# Patient Record
Sex: Male | Born: 1970
Health system: Southern US, Community
[De-identification: ages and names within clinical notes are randomized; demographics above are authoritative.]

## PROBLEM LIST (undated history)

## (undated) DIAGNOSIS — J189 Pneumonia, unspecified organism: Secondary | ICD-10-CM

## (undated) DIAGNOSIS — J45909 Unspecified asthma, uncomplicated: Secondary | ICD-10-CM

## (undated) HISTORY — PX: KNEE SURGERY: SHX244

---

## 2007-10-20 ENCOUNTER — Encounter: Admission: RE | Admit: 2007-10-20 | Discharge: 2007-10-20 | Payer: Self-pay | Admitting: Specialist

## 2013-03-19 ENCOUNTER — Encounter (HOSPITAL_BASED_OUTPATIENT_CLINIC_OR_DEPARTMENT_OTHER): Payer: Self-pay | Admitting: Emergency Medicine

## 2013-03-19 ENCOUNTER — Emergency Department (HOSPITAL_BASED_OUTPATIENT_CLINIC_OR_DEPARTMENT_OTHER): Payer: BC Managed Care – PPO

## 2013-03-19 ENCOUNTER — Inpatient Hospital Stay (HOSPITAL_BASED_OUTPATIENT_CLINIC_OR_DEPARTMENT_OTHER)
Admission: EM | Admit: 2013-03-19 | Discharge: 2013-03-22 | DRG: 871 | Disposition: A | Discharge status: Home / Self Care | Payer: BC Managed Care – PPO | Attending: Internal Medicine | Admitting: Internal Medicine

## 2013-03-19 DIAGNOSIS — R6510 Systemic inflammatory response syndrome (SIRS) of non-infectious origin without acute organ dysfunction: Secondary | ICD-10-CM | POA: Diagnosis present

## 2013-03-19 DIAGNOSIS — A419 Sepsis, unspecified organism: Secondary | ICD-10-CM | POA: Diagnosis present

## 2013-03-19 DIAGNOSIS — R739 Hyperglycemia, unspecified: Secondary | ICD-10-CM

## 2013-03-19 DIAGNOSIS — R0902 Hypoxemia: Secondary | ICD-10-CM | POA: Diagnosis present

## 2013-03-19 DIAGNOSIS — J189 Pneumonia, unspecified organism: Secondary | ICD-10-CM | POA: Diagnosis present

## 2013-03-19 DIAGNOSIS — R7309 Other abnormal glucose: Secondary | ICD-10-CM | POA: Diagnosis present

## 2013-03-19 DIAGNOSIS — J45909 Unspecified asthma, uncomplicated: Secondary | ICD-10-CM | POA: Diagnosis present

## 2013-03-19 DIAGNOSIS — Z87891 Personal history of nicotine dependence: Secondary | ICD-10-CM

## 2013-03-19 HISTORY — DX: Unspecified asthma, uncomplicated: J45.909

## 2013-03-19 LAB — TROPONIN I

## 2013-03-19 LAB — CBC WITH DIFFERENTIAL/PLATELET
BASOS ABS: 0 10*3/uL (ref 0.0–0.1)
BASOS PCT: 0 % (ref 0–1)
Eosinophils Absolute: 0 10*3/uL (ref 0.0–0.7)
Eosinophils Relative: 0 % (ref 0–5)
HEMATOCRIT: 37.2 % — AB (ref 39.0–52.0)
HEMOGLOBIN: 12.9 g/dL — AB (ref 13.0–17.0)
LYMPHS ABS: 0.5 10*3/uL — AB (ref 0.7–4.0)
LYMPHS PCT: 11 % — AB (ref 12–46)
MCH: 32.2 pg (ref 26.0–34.0)
MCHC: 34.7 g/dL (ref 30.0–36.0)
MCV: 92.8 fL (ref 78.0–100.0)
MONOS PCT: 2 % — AB (ref 3–12)
Monocytes Absolute: 0.1 10*3/uL (ref 0.1–1.0)
NEUTROS ABS: 3.9 10*3/uL (ref 1.7–7.7)
Neutrophils Relative %: 87 % — ABNORMAL HIGH (ref 43–77)
Platelets: 213 10*3/uL (ref 150–400)
RBC: 4.01 MIL/uL — ABNORMAL LOW (ref 4.22–5.81)
RDW: 12.4 % (ref 11.5–15.5)
WBC: 4.5 10*3/uL (ref 4.0–10.5)

## 2013-03-19 LAB — COMPREHENSIVE METABOLIC PANEL
ALBUMIN: 2.7 g/dL — AB (ref 3.5–5.2)
ALK PHOS: 41 U/L (ref 39–117)
ALT: 18 U/L (ref 0–53)
AST: 19 U/L (ref 0–37)
BILIRUBIN TOTAL: 0.5 mg/dL (ref 0.3–1.2)
BUN: 12 mg/dL (ref 6–23)
CHLORIDE: 102 meq/L (ref 96–112)
CO2: 22 mEq/L (ref 19–32)
CREATININE: 1 mg/dL (ref 0.50–1.35)
Calcium: 8.2 mg/dL — ABNORMAL LOW (ref 8.4–10.5)
GLUCOSE: 188 mg/dL — AB (ref 70–99)
POTASSIUM: 3.7 meq/L (ref 3.7–5.3)
Sodium: 138 mEq/L (ref 137–147)
Total Protein: 6.8 g/dL (ref 6.0–8.3)

## 2013-03-19 LAB — INFLUENZA PANEL BY PCR (TYPE A & B)
H1N1FLUPCR: NOT DETECTED
Influenza A By PCR: NEGATIVE
Influenza B By PCR: NEGATIVE

## 2013-03-19 LAB — GLUCOSE, CAPILLARY: Glucose-Capillary: 118 mg/dL — ABNORMAL HIGH (ref 70–99)

## 2013-03-19 LAB — CG4 I-STAT (LACTIC ACID): LACTIC ACID, VENOUS: 0.83 mmol/L (ref 0.5–2.2)

## 2013-03-19 MED ORDER — ACETAMINOPHEN 325 MG PO TABS
650.0000 mg | ORAL_TABLET | Freq: Four times a day (QID) | ORAL | Status: DC | PRN
  Administered 2013-03-19 – 2013-03-20 (×2): 650 mg via ORAL
  Filled 2013-03-19 (×2): qty 2

## 2013-03-19 MED ORDER — ONDANSETRON HCL 4 MG PO TABS: 4.0000 mg | ORAL_TABLET | Freq: Four times a day (QID) | ORAL | Status: DC | PRN

## 2013-03-19 MED ORDER — IBUPROFEN 200 MG PO TABS
600.0000 mg | ORAL_TABLET | Freq: Once | ORAL | Status: AC
  Administered 2013-03-19: 600 mg via ORAL
  Filled 2013-03-19 (×2): qty 1

## 2013-03-19 MED ORDER — SODIUM CHLORIDE 0.9 % IV SOLN
INTRAVENOUS | Status: DC
  Administered 2013-03-19: 08:00:00 via INTRAVENOUS

## 2013-03-19 MED ORDER — SODIUM CHLORIDE 0.9 % IV SOLN
INTRAVENOUS | Status: DC
  Administered 2013-03-19 – 2013-03-20 (×3): via INTRAVENOUS

## 2013-03-19 MED ORDER — SODIUM CHLORIDE 0.9 % IV SOLN
Freq: Once | INTRAVENOUS | Status: AC
  Administered 2013-03-19: 07:00:00 via INTRAVENOUS

## 2013-03-19 MED ORDER — ENOXAPARIN SODIUM 40 MG/0.4ML ~~LOC~~ SOLN
40.0000 mg | SUBCUTANEOUS | Status: DC
  Filled 2013-03-19 (×2): qty 0.4

## 2013-03-19 MED ORDER — DM-GUAIFENESIN ER 30-600 MG PO TB12
1.0000 | ORAL_TABLET | Freq: Two times a day (BID) | ORAL | Status: DC
  Filled 2013-03-19: qty 1

## 2013-03-19 MED ORDER — AZITHROMYCIN 500 MG PO TABS
500.0000 mg | ORAL_TABLET | ORAL | Status: DC
  Administered 2013-03-20 – 2013-03-22 (×3): 500 mg via ORAL
  Filled 2013-03-19 (×5): qty 1

## 2013-03-19 MED ORDER — DEXTROSE 5 % IV SOLN
1.0000 g | Freq: Once | INTRAVENOUS | Status: AC
  Administered 2013-03-19: 1 g via INTRAVENOUS

## 2013-03-19 MED ORDER — DEXTROSE 5 % IV SOLN
500.0000 mg | Freq: Once | INTRAVENOUS | Status: AC
  Administered 2013-03-19: 500 mg via INTRAVENOUS

## 2013-03-19 MED ORDER — GUAIFENESIN-DM 100-10 MG/5ML PO SYRP
5.0000 mL | ORAL_SOLUTION | ORAL | Status: DC | PRN
Start: 2013-03-19 — End: 2013-03-22
  Administered 2013-03-19 – 2013-03-21 (×4): 5 mL via ORAL
  Filled 2013-03-19 (×4): qty 5

## 2013-03-19 MED ORDER — CEFTRIAXONE SODIUM 1 G IJ SOLR
INTRAMUSCULAR | Status: AC
Start: 2013-03-19 — End: 2013-03-19
  Administered 2013-03-19: 10:00:00
  Filled 2013-03-19: qty 10

## 2013-03-19 MED ORDER — ACETAMINOPHEN 650 MG RE SUPP: 650.0000 mg | Freq: Four times a day (QID) | RECTAL | Status: DC | PRN

## 2013-03-19 MED ORDER — ONDANSETRON HCL 4 MG/2ML IJ SOLN
4.0000 mg | Freq: Four times a day (QID) | INTRAMUSCULAR | Status: DC | PRN
Start: 2013-03-19 — End: 2013-03-22

## 2013-03-19 MED ORDER — DEXTROSE 5 % IV SOLN
1.0000 g | INTRAVENOUS | Status: DC
  Administered 2013-03-20 – 2013-03-22 (×3): 1 g via INTRAVENOUS
  Filled 2013-03-19 (×3): qty 10

## 2013-03-19 NOTE — ED Notes (Signed)
LAC drawn and results hand delivered to Dr. Judd Lienelo  .83 results.

## 2013-03-19 NOTE — ED Notes (Signed)
[facility] has been notified of [facility] 6E02C at [facility]

## 2013-03-19 NOTE — ED Provider Notes (Signed)
CSN: [id number]     Arrival date & time 03/19/13  0605 History   First MD Initiated Contact with Patient 03/19/13 0617     Chief Complaint  Patient presents with  . Fever   (Consider location/radiation/quality/duration/timing/severity/associated sxs/prior Treatment) HPI Comments: Patient is an otherwise healthy [age] year old male who presents with complaints of fever for the past 5 days associated with cough, vomiting, diarrhea, and chills. He reports feeling tired and achy all over.    Patient is a 43 y.o. male presenting with fever. The history is provided by the patient.  Fever Severity:  Moderate Onset quality:  Gradual Duration:  5 days Timing:  Constant Progression:  Worsening Chronicity:  New Relieved by:  Nothing Worsened by:  Nothing tried Ineffective treatments:  None tried Associated symptoms: chest pain, chills, congestion, cough, diarrhea, headaches and myalgias     Past Medical History  Diagnosis Date  . Asthma    History reviewed. No pertinent past surgical history. History reviewed. No pertinent family history. History  Substance Use Topics  . Smoking status: Former Games developermoker  . Smokeless tobacco: Not on file  . Alcohol Use: Yes    Review of Systems  Constitutional: Positive for fever, chills and fatigue.  HENT: Positive for congestion.   Respiratory: Positive for cough.   Cardiovascular: Positive for chest pain.  Gastrointestinal: Positive for diarrhea.  Musculoskeletal: Positive for myalgias.  Neurological: Positive for headaches.  All other systems reviewed and are negative.    Allergies  Review of patient's allergies indicates no known allergies.  Home Medications  No current outpatient prescriptions on file. BP 107/61  Pulse 137  Temp(Src) 98.8 F (37.1 C) (Oral)  Resp 24  Wt 195 lb (88.451 kg)  SpO2 91% Physical Exam  Nursing note and vitals reviewed. Constitutional: He is oriented to person, place, and time. He appears well-developed  and well-nourished.  Patient is a pale appearing [age] year old male in no acute distress.  HENT:  Head: Normocephalic and atraumatic.  Mouth/Throat: Oropharynx is clear and moist.  Neck: Normal range of motion. Neck supple.  Cardiovascular: Regular rhythm.   No murmur heard. Heart is tachycardic without murmur.  Pulmonary/Chest: Effort normal and breath sounds normal. No respiratory distress. He has no wheezes.  Abdominal: Soft. Bowel sounds are normal. He exhibits no distension. There is no tenderness.  Musculoskeletal: Normal range of motion. He exhibits no edema.  Lymphadenopathy:    He has no cervical adenopathy.  Neurological: He is alert and oriented to person, place, and time.  Skin: Skin is warm and dry.    ED Course  Procedures (including critical care time) Labs Review Labs Reviewed  CBC WITH DIFFERENTIAL  COMPREHENSIVE METABOLIC PANEL  TROPONIN I   Imaging Review No results found.   Date: 03/19/2013  Rate: 123  Rhythm: sinus tachycardia  QRS Axis: normal  Intervals: normal  ST/T Wave abnormalities: nonspecific T wave changes  Conduction Disutrbances:none  Narrative Interpretation:   Old EKG Reviewed: none available    MDM  No diagnosis found. Workup reveals an infiltrate in the left lower lobe consistent with pneumonia.  He has no wbc, however he is tachycardic with heart rate in the 120-130's and hypoxic with oxygen saturations in the low 90's.  He will be given rocephin and zithromax and I will discuss admission with the [facility].  Dr. Waymon AmatoHongalgi agrees to admit.    Geoffery Lyonsouglas Koa Palla, MD 03/19/13 [phone]

## 2013-03-19 NOTE — ED Notes (Signed)
Pt reports fever with cough and N/V/D x 4 days tylenol 1000 mg @ 0500 this AM

## 2013-03-19 NOTE — ED Notes (Addendum)
REPORT: Attempted to care reports to 6E02, primary care RN unable to take report at present time. Will call back when able to take report.

## 2013-03-19 NOTE — H&P (Addendum)
[facility] History and Physical  Jesse Berg ZOX:[id number] DOB: [birth year] DOA: 03/19/2013  Referring physician: Dr Judd Lienelo PCP: REDMON,NOELLE, PA-C   Chief Complaint:  Cough with fever and chills x 5 days   HPI:  43 year old male with no significant past medical history [facility] we've symptoms of persistent productive cough with fever and chills for last 5 days. Patient reports having symptoms of cough with nasal and throat congestion which was followed by several episodes of watery diarrhea. He started having fever of 102F the next day and went to see his PCP. He reports being checked for flu and was negative. He was discharged home but continued to have cough with brown and  greenish phlegm. He reports loss of appetite and being fatigued. He continued to have temperature spikes upto 102.8 F. his diarrhea resolved 3 days back. He had an episode of vomiting yesterday evening. He reports that his wife and 41-year-old daughter recently had viral URI symptoms. He denies any recent travel, chest pain, palpitations, headaches, blurred vision, dizziness, SOB, abdominal pain, urinary symptoms, muscle aches or joint pains.  Course in the ED Patient had low-grade temperature of 99.51F, was tachycardic to 137 with respiratory rate of 24 and stable blood pressure. His O2 sat dropped to low 90s. A chest x-ray done showed left lingular infiltrate suggestive of pneumonia. Patient given a dose of IV Rocephin and azithromycin and discussed with Dr. hospitalist for admission to medical floor.  Review of Systems:  Constitutional:  fever, chills,  appetite change and fatigue.  denies diaphoresis, HEENT: Congestion and sore throat, Denies photophobia, eye pain, redness,  ear pain, rhinorrhea, sneezing, mouth sores, trouble swallowing, neck pain, neck stiffness and tinnitus.    Respiratory: cough, chest tightness,Denies SOB, DOE,   and wheezing.   Cardiovascular: Denies chest  pain, palpitations and leg swelling.  Gastrointestinal: Nausea, vomiting and diarrhea, Denies  abdominal pain,  constipation, blood in stool and abdominal distention.  Genitourinary: Denies dysuria, urgency, frequency, hematuria, flank pain and difficulty urinating.  Musculoskeletal: has Some myalgia, denies back pain, joint swelling, arthralgias and gait problem.  Neurological: Denies dizziness, seizures, syncope, weakness, light-headedness, numbness and headaches.    Past Medical History  Diagnosis Date  . Asthma    History reviewed. No pertinent past surgical history. Social History:  reports that he has quit smoking. He does not have any smokeless tobacco history on file. He reports that he drinks alcohol. He reports that he does not use illicit drugs.  No Known Allergies  History reviewed. No pertinent family history.  Prior to Admission medications   Not on File    Physical Exam:  Filed Vitals:   03/19/13 0921 03/19/13 1223 03/19/13 1349 03/19/13 1444  BP: 120/80 106/62 108/59 104/70  Pulse: 122 102 122 82  Temp: 97.9 F (36.6 C) 99 F (37.2 C)  98.3 F (36.8 C)  TempSrc: Oral Oral  Axillary  Resp: 18  18 20   Weight:      SpO2: 96% 100% 100% 94%    Constitutional: Vital signs reviewed. Patient is a middle aged male in no acute distress HEENT: No pallor, no icterus, most cervical lymphadenopathy, dry oral mucosa Cardiovascular: RRR, S1 normal, S2 normal, no MRG,  Pulmonary/Chest: CTAB, no wheezes, rales, or rhonchi Abdominal: Soft. Non-tender, non-distended, bowel sounds are normal, no masses, organomegaly,   extremities: Warm, no edema CNS: AAO x3   Labs on Admission:  Basic Metabolic Panel:  Recent Labs Lab 03/19/13 ([phone]) [phone]  NA 138  K 3.7  CL 102  CO2 22  GLUCOSE 188*  BUN 12  CREATININE 1.00  CALCIUM 8.2*   Liver Function Tests:  Recent Labs Lab 03/19/13 0622  AST 19  ALT 18  ALKPHOS 41  BILITOT 0.5  PROT 6.8  ALBUMIN 2.7*   No results  found for this basename: LIPASE, AMYLASE,  in the last 168 hours No results found for this basename: AMMONIA,  in the last 168 hours CBC:  Recent Labs Lab 03/19/13 0622  WBC 4.5  NEUTROABS 3.9  HGB 12.9*  HCT 37.2*  MCV 92.8  PLT 213   Cardiac Enzymes:  Recent Labs Lab 03/19/13 0622  TROPONINI <0.30   BNP: No components found with this basename: POCBNP,  CBG: No results found for this basename: GLUCAP,  in the last 168 hours  Radiological Exams on Admission: Dg Chest 2 View  03/19/2013   CLINICAL DATA:  Fever, cough.  EXAM: CHEST  2 VIEW  COMPARISON:  None.  FINDINGS: Left lower lobe consolidation and to a lesser extent lingular opacity. Right lung clear. Cardiomediastinal contours within normal range. No pleural effusion or pneumothorax. No acute osseous finding.  IMPRESSION: Left lower lobe/lingular pneumonia.  Recommend radiograph follow up after treatment to document resolution.   Electronically Signed   By: Jearld Lesch M.D.   On: 03/19/2013 06:49      Assessment/Plan  Principal Problem:   CAP (community acquired pneumonia) Admit to MedSurg. Patient does meet criteria for source with tachypnea and tachycardia. We'll treat him for community-acquired pneumonia lead IV Rocephin and azithromycin (patient got a dose earlier today in the ED) Ordered blood culture, sputum culture, urine for strep antigen and Legionella. Check HIV. -Supportive care with IV fluids, Tylenol and antitussives -Check for flu PCR. Ordered Droplet precautions. Given onset of symptoms of 5 days already I would not start him on Tamiflu. -Continue O2 via nasal cannula and monitor O2 sat.  -Patient will need followup chest x-ray in 4-6 weeks to evaluate for resolution of pneumonia.  Elevated blood glucose.  No hx of DM. Check fsg BID  Diet: Regular DVT prophylaxis: Subcutaneous Lovenox  Code Status: Full code Family Communication: Wife at bedside Disposition Plan: Home if stable  tomorrow  Eddie North [facility] Pager [phone]  If 7PM-7AM, please contact night-coverage [email or web address] Password [facility] 03/19/2013, 3:01 PM    Total time spent on admission: 70 minutes

## 2013-03-20 DIAGNOSIS — J45909 Unspecified asthma, uncomplicated: Secondary | ICD-10-CM | POA: Diagnosis present

## 2013-03-20 DIAGNOSIS — R7309 Other abnormal glucose: Secondary | ICD-10-CM

## 2013-03-20 DIAGNOSIS — R739 Hyperglycemia, unspecified: Secondary | ICD-10-CM | POA: Diagnosis present

## 2013-03-20 LAB — GLUCOSE, CAPILLARY
Glucose-Capillary: 147 mg/dL — ABNORMAL HIGH (ref 70–99)
Glucose-Capillary: 144 mg/dL — ABNORMAL HIGH (ref 70–99)

## 2013-03-20 LAB — STREP PNEUMONIAE URINARY ANTIGEN: Strep Pneumo Urinary Antigen: NEGATIVE

## 2013-03-20 LAB — HIV ANTIBODY (ROUTINE TESTING W REFLEX): HIV: NONREACTIVE

## 2013-03-20 LAB — HEMOGLOBIN A1C
Hgb A1c MFr Bld: 5.5 % (ref <5.7)
Mean Plasma Glucose: 111 mg/dL (ref <117)

## 2013-03-20 MED ORDER — ACETAMINOPHEN 650 MG RE SUPP: 650.0000 mg | Freq: Four times a day (QID) | RECTAL | Status: DC | PRN

## 2013-03-20 MED ORDER — BENZONATATE 100 MG PO CAPS
200.0000 mg | ORAL_CAPSULE | Freq: Three times a day (TID) | ORAL | Status: DC
  Administered 2013-03-20 – 2013-03-22 (×3): 200 mg via ORAL
  Filled 2013-03-20 (×10): qty 2

## 2013-03-20 MED ORDER — ACETAMINOPHEN 325 MG PO TABS
650.0000 mg | ORAL_TABLET | ORAL | Status: DC | PRN
  Administered 2013-03-20 – 2013-03-21 (×3): 650 mg via ORAL
  Filled 2013-03-20 (×3): qty 2

## 2013-03-20 MED ORDER — SODIUM CHLORIDE 0.9 % IV SOLN
INTRAVENOUS | Status: AC
  Administered 2013-03-20 – 2013-03-21 (×3): via INTRAVENOUS

## 2013-03-20 NOTE — Progress Notes (Signed)
Pts wife verbally upset with care pt has been given. States "cough should be better, he shouldn't have to ask for cough medicine and tylenol, it should be scheduled." Doctor made aware.

## 2013-03-20 NOTE — Progress Notes (Signed)
PROGRESS NOTE    Jesse Berg WUJ:[id number] DOB: August 18, 1970 DOA: 03/19/2013 PCP: [place] PhlegmEDMON,NOELLE, PA-C  HPI/Brief narrative 43 year old male patient with history of asthma, [facility] with 5 days history of productive cough, fever, chills, nasal and throat congestion and watery diarrhea. Seen by PCP and flu testing apparently negative. Patient's 41-year-old daughter recently had viral URI symptoms. His symptoms progressively got worse. Chest x-ray showed left lingular pneumonia. [facility] and management.  Assessment/Plan:  Community acquired pneumonia/SIRS - Patient was empirically started on IV ceftriaxone and azithromycin-continue same. Continue brief IV fluid hydration secondary to poor oral intake. - Workup: Urinary streptococcal antigen negative, HIV antibody nonreactive, blood cultures x2 negative to date, influenza panel PCR: Negative. Even though patient did not have leukocytosis, toxic granulations were seen. - Overall slightly better. Patient has some blood streaking sputum-DC Lovenox. States that Mucinex causes him to have worsening cough and vomiting. Will add Tessalon Perles for cough. Continue Tylenol when necessary for fever and mild pain.? Element of viral bronchitis - Monitor closely. Patient will need followup chest x-ray in 4-6 weeks.  History of asthma - Stable. No clinical bronchospasm.  History of diarrhea - Seems to have resolved.  Hyperglycemia - CBGs ranging between 147-118. Check A1c.   Code Status: Full Family Communication: Discussed at length with spouse at bedside, updated care and answered questions. Disposition Plan: Home when medically stable   Consultants:  None  Procedures:  None  Antibiotics:  IV Rocephin 1/26 >  Azithromycin 1/26 >   Subjective: Dyspnea there. Continues to have hacking cough which is intermittently productive of brownish sputum  and streaks of blood. Complaints of headache and weakness secondary to violent coughing spells. Unable to sleep last night and had bad dreams-declines sleep aids at this time. Appetite. Diarrhea resolved.  Objective: Filed Vitals:   03/19/13 1634 03/19/13 2017 03/20/13 0515 03/20/13 0900  BP: 119/66 116/71 127/93 138/79  Pulse: 119 120 122 125  Temp: 98.9 F (37.2 C) 99 F (37.2 C) 100.2 F (37.9 C) 99 F (37.2 C)  TempSrc: Oral Oral Oral Oral  Resp: 20 18 18 20   Height:      Weight:  89.449 kg (197 lb 3.2 oz)    SpO2: 96% 63% 94% 99%    Intake/Output Summary (Last 24 hours) at 03/20/13 1106 Last data filed at 03/20/13 0600  Gross per 24 hour  Intake 1856.25 ml  Output    500 ml  Net 1356.25 ml   Filed Weights   03/19/13 0611 03/19/13 2017  Weight: 88.451 kg (195 lb) 89.449 kg (197 lb 3.2 oz)     Exam:  General exam: Young male, appears uncomfortable but not toxic and in no obvious distress. Intermittent hacking cough spells. Respiratory system: Reduced breath sounds left lung fields with bronchial breath sounds heard in the left base with associated fever crackles. No wheezing or rhonchi.. No increased work of breathing. Throat exam without acute findings. Cardiovascular system: S1 & S2 heard, regular mild tachycardia. No JVD, murmurs, gallops, clicks or pedal edema. Gastrointestinal system: Abdomen is nondistended, soft and nontender. Normal bowel sounds heard. Central nervous system: Alert and oriented. No focal neurological deficits. Extremities: Symmetric 5 x 5 power.   Data Reviewed: Basic Metabolic Panel:  Recent Labs Lab 03/19/13 0622  NA 138  K 3.7  CL 102  CO2 22  GLUCOSE 188*  BUN 12  CREATININE 1.00  CALCIUM 8.2*  Liver Function Tests:  Recent Labs Lab 03/19/13 0622  AST 19  ALT 18  ALKPHOS 41  BILITOT 0.5  PROT 6.8  ALBUMIN 2.7*   No results found for this basename: LIPASE, AMYLASE,  in the last 168 hours No results found for this  basename: AMMONIA,  in the last 168 hours CBC:  Recent Labs Lab 03/19/13 0622  WBC 4.5  NEUTROABS 3.9  HGB 12.9*  HCT 37.2*  MCV 92.8  PLT 213   Cardiac Enzymes:  Recent Labs Lab 03/19/13 0622  TROPONINI <0.30   BNP (last 3 results) No results found for this basename: PROBNP,  in the last 8760 hours CBG:  Recent Labs Lab 03/19/13 1828 03/20/13 0641  GLUCAP 118* 147*    No results found for this or any previous visit (from the past 240 hour(s)).     Studies: Dg Chest 2 View  03/19/2013   CLINICAL DATA:  Fever, cough.  EXAM: CHEST  2 VIEW  COMPARISON:  None.  FINDINGS: Left lower lobe consolidation and to a lesser extent lingular opacity. Right lung clear. Cardiomediastinal contours within normal range. No pleural effusion or pneumothorax. No acute osseous finding.  IMPRESSION: Left lower lobe/lingular pneumonia.  Recommend radiograph follow up after treatment to document resolution.   Electronically Signed   By: Jearld Berg M.D.   On: 03/19/2013 06:49        Scheduled Meds: . azithromycin  500 mg Oral Q24H  . benzonatate  200 mg Oral TID  . cefTRIAXone (ROCEPHIN)  IV  1 g Intravenous Q24H   Continuous Infusions: . sodium chloride 100 mL/hr at 03/20/13 1610    Principal Problem:   CAP (community acquired pneumonia) Active Problems:   SIRS (systemic inflammatory response syndrome)    Time spent: 45 minutes    Jesse Barman, MD, FACP, FHM. [facility] Pager [phone]  If 7PM-7AM, please contact night-coverage [email or web address] Password [email or web address] 03/20/2013, 11:06 AM    LOS: 1 day

## 2013-03-21 ENCOUNTER — Inpatient Hospital Stay (HOSPITAL_COMMUNITY): Payer: BC Managed Care – PPO

## 2013-03-21 ENCOUNTER — Encounter (HOSPITAL_COMMUNITY): Payer: Self-pay | Admitting: Radiology

## 2013-03-21 LAB — BASIC METABOLIC PANEL
BUN: 9 mg/dL (ref 6–23)
CALCIUM: 8 mg/dL — AB (ref 8.4–10.5)
CHLORIDE: 106 meq/L (ref 96–112)
CO2: 24 meq/L (ref 19–32)
Creatinine, Ser: 0.91 mg/dL (ref 0.50–1.35)
GFR calc Af Amer: >90 mL/min (ref >90)
GFR calc non Af Amer: >90 mL/min (ref >90)
GLUCOSE: 108 mg/dL — AB (ref 70–99)
Potassium: 3.5 mEq/L — ABNORMAL LOW (ref 3.7–5.3)
Sodium: 143 mEq/L (ref 137–147)

## 2013-03-21 LAB — CBC
HEMATOCRIT: 31.5 % — AB (ref 39.0–52.0)
Hemoglobin: 11.1 g/dL — ABNORMAL LOW (ref 13.0–17.0)
MCH: 32.1 pg (ref 26.0–34.0)
MCHC: 35.2 g/dL (ref 30.0–36.0)
MCV: 91 fL (ref 78.0–100.0)
Platelets: 314 10*3/uL (ref 150–400)
RBC: 3.46 MIL/uL — AB (ref 4.22–5.81)
RDW: 13.4 % (ref 11.5–15.5)
WBC: 8.5 10*3/uL (ref 4.0–10.5)

## 2013-03-21 LAB — LEGIONELLA ANTIGEN, URINE: LEGIONELLA ANTIGEN, URINE: NEGATIVE

## 2013-03-21 LAB — GLUCOSE, CAPILLARY
Glucose-Capillary: 120 mg/dL — ABNORMAL HIGH (ref 70–99)
Glucose-Capillary: 111 mg/dL — ABNORMAL HIGH (ref 70–99)

## 2013-03-21 MED ORDER — ALUM & MAG HYDROXIDE-SIMETH 200-200-20 MG/5ML PO SUSP
30.0000 mL | Freq: Four times a day (QID) | ORAL | Status: DC | PRN
  Filled 2013-03-21: qty 30

## 2013-03-21 NOTE — Progress Notes (Signed)
[facility] PROGRESS NOTE  Jesse BryantKevin S Hollyfield XBJ:[id number] DOB: [birth year] DOA: 03/19/2013 PCP: [place] PhlegmEDMON,NOELLE, PA-C  Brief history  43 year old male patient with history of asthma, [facility] with 5 days history of productive cough, fever, chills, nasal and throat congestion and watery diarrhea. Seen by PCP and flu testing apparently negative. Patient's 41-year-old daughter recently had viral URI symptoms. His symptoms progressively got worse. Chest x-ray showed left lingular pneumonia. [facility] evaluation and management.   Assessment/Plan:  1. Community acquired pneumonia/SIRS  - Patient was empirically started on IV ceftriaxone and azithromycin-continue same. Continue brief IV fluid hydration secondary to poor oral intake.  - Workup: Urinary streptococcal antigen negative, HIV antibody nonreactive, blood cultures x2 negative to date, influenza panel PCR: Negative. Even though patient did not have leukocytosis, toxic granulations were seen.  - obtain chest CT for better eval;  2. History of asthma  - Stable. No clinical bronchospasm.  3. History of diarrhea  - Seems to have resolved.  4. Hyperglycemia  - CBGs ranging between 147-118. Check A1c-5.5   Code Status: full Family Communication: d/w patient, his wife (indicate person spoken with, relationship, and if by phone, the number) Disposition Plan: home when stable    Consultants:  None   Procedures:  None   Antibiotics:  ceftriaxon 1/26<<<<  azythro 1/26<<<<(indicate start date, and stop date if known)  HPI/Subjective: alert  Objective: Filed Vitals:   03/21/13 1000  BP: 135/82  Pulse: 88  Temp: 98 F (36.7 C)  Resp: 22    Intake/Output Summary (Last 24 hours) at 03/21/13 1002 Last data filed at 03/21/13 0900  Gross per 24 hour  Intake    600 ml  Output    200 ml  Net    400 ml   Filed Weights   03/19/13 0611 03/19/13 2017   Weight: 88.451 kg (195 lb) 89.449 kg (197 lb 3.2 oz)    Exam:   General:  alert  Cardiovascular: s1,s2 rrr  Respiratory: L lung rales  Abdomen: soft, nt, nd  Musculoskeletal: no edema   Data Reviewed: Basic Metabolic Panel:  Recent Labs Lab 03/19/13 0622 03/21/13 0635  NA 138 143  K 3.7 3.5*  CL 102 106  CO2 22 24  GLUCOSE 188* 108*  BUN 12 9  CREATININE 1.00 0.91  CALCIUM 8.2* 8.0*   Liver Function Tests:  Recent Labs Lab 03/19/13 0622  AST 19  ALT 18  ALKPHOS 41  BILITOT 0.5  PROT 6.8  ALBUMIN 2.7*   No results found for this basename: LIPASE, AMYLASE,  in the last 168 hours No results found for this basename: AMMONIA,  in the last 168 hours CBC:  Recent Labs Lab 03/19/13 0622 03/21/13 0635  WBC 4.5 8.5  NEUTROABS 3.9  --   HGB 12.9* 11.1*  HCT 37.2* 31.5*  MCV 92.8 91.0  PLT 213 314   Cardiac Enzymes:  Recent Labs Lab 03/19/13 0622  TROPONINI <0.30   BNP (last 3 results) No results found for this basename: PROBNP,  in the last 8760 hours CBG:  Recent Labs Lab 03/19/13 1828 03/20/13 0641 03/20/13 1618 03/21/13 0612  GLUCAP 118* 147* 144* 120*    Recent Results (from the past 240 hour(s))  CULTURE, BLOOD (ROUTINE X 2)     Status: None   Collection Time    03/19/13  6:57 PM      Result Value Range Status   Specimen Description  BLOOD ARM LEFT   Final   Special Requests BOTTLES DRAWN AEROBIC AND ANAEROBIC 10CC   Final   Culture  Setup Time     Final   Value: 03/20/2013 01:16     Performed at [facility]   Culture     Final   Value:        BLOOD CULTURE RECEIVED NO GROWTH TO DATE CULTURE WILL BE HELD FOR 5 DAYS BEFORE ISSUING A FINAL NEGATIVE REPORT     Performed at [facility]   Report Status PENDING   Incomplete  CULTURE, BLOOD (ROUTINE X 2)     Status: None   Collection Time    03/19/13  7:10 PM      Result Value Range Status   Specimen Description BLOOD HAND LEFT   Final   Special Requests BOTTLES  DRAWN AEROBIC AND ANAEROBIC 10CC   Final   Culture  Setup Time     Final   Value: 03/20/2013 01:15     Performed at [facility]   Culture     Final   Value:        BLOOD CULTURE RECEIVED NO GROWTH TO DATE CULTURE WILL BE HELD FOR 5 DAYS BEFORE ISSUING A FINAL NEGATIVE REPORT     Performed at [facility]   Report Status PENDING   Incomplete     Studies: No results found.  Scheduled Meds: . azithromycin  500 mg Oral Q24H  . benzonatate  200 mg Oral TID  . cefTRIAXone (ROCEPHIN)  IV  1 g Intravenous Q24H   Continuous Infusions:   Principal Problem:   CAP (community acquired pneumonia) Active Problems:   SIRS (systemic inflammatory response syndrome)   Asthma   Hyperglycemia    Time spent: >35 mionutes     Esperanza Sheets  [facility] Pager [phone]. If 7PM-7AM, please contact night-coverage at [email or web address], password [facility] 03/21/2013, 10:02 AM  LOS: 2 days

## 2013-03-22 LAB — GLUCOSE, CAPILLARY
GLUCOSE-CAPILLARY: 93 mg/dL (ref 70–99)
GLUCOSE-CAPILLARY: 146 mg/dL — AB (ref 70–99)

## 2013-03-22 MED ORDER — LEVOFLOXACIN 750 MG PO TABS: 750.0000 mg | ORAL_TABLET | Freq: Every day | ORAL | Status: DC

## 2013-03-22 MED ORDER — BENZONATATE 200 MG PO CAPS: 200.0000 mg | ORAL_CAPSULE | Freq: Three times a day (TID) | ORAL | Status: DC

## 2013-03-22 NOTE — Progress Notes (Signed)
Discharge instructions, new medications, and follow-up appts reviewed with patient. PIV removed. Belongings returned to patient. Waiting on wife to arrive for transportation.  [name], [name]

## 2013-03-22 NOTE — Discharge Summary (Signed)
Physician Discharge Summary  Jesse Berg ZOX:[id number] DOB: 03-29-1970 DOA: 03/19/2013  PCP: REDMON,NOELLE, PA-C  Admit date: 03/19/2013 Discharge date: 03/22/2013  Time spent: >35 minutes  Recommendations for Outpatient Follow-up:  F/u with PCP in 1-2 weeks  Discharge Diagnoses:  Principal Problem:   CAP (community acquired pneumonia) Active Problems:   SIRS (systemic inflammatory response syndrome)   Asthma   Hyperglycemia   Discharge Condition: stable   Diet recommendation: regaular   Filed Weights   03/19/13 8119 03/19/13 2017  Weight: 88.451 kg (195 lb) 89.449 kg (197 lb 3.2 oz)    History of present illness:  43 year old male patient with history of asthma, presented to med [facility] with 5 days history of productive cough, fever, chills, nasal and throat congestion and watery diarrhea. Seen by PCP and flu testing apparently negative. Patient's 46-year-old daughter recently had viral URI symptoms. His symptoms progressively got worse. Chest x-ray showed left lingular pneumonia. Patient was transferred to [facility] for further evaluation and management.  Hospital Course:  1. Community acquired pneumonia/SIRS; CT chest: multifocal pneumonia L>R -Urinary streptococcal antigen negative, HIV antibody nonreactive, blood cultures x2 negative to date, influenza panel PCR: Negative.  -improved on IV atx, changed to PO levofloxacin; need CXR in 4 week to f/u  2. History of asthma  - Stable. No clinical bronchospasm.  3. History of diarrhea  - Seems to have resolved.  4. Hyperglycemia  - CBGs ranging between 147-118. Check A1c-5.5     Procedures:  CT  (i.e. Studies not automatically included, echos, thoracentesis, etc; not x-rays)  Consultations:  None   Discharge Exam: Filed Vitals:   03/22/13 0910  BP: 139/83  Pulse: 79  Temp: 97.8 F (36.6 C)  Resp: 19    General: alert Cardiovascular: s1,s2 rrr Respiratory: few crackles in  LL  Discharge Instructions  Discharge Orders   Future Orders Complete By Expires   Diet - low sodium heart healthy  As directed    Discharge instructions  As directed    Comments:     Please follow up with primary carte doctor in 1 week   Increase activity slowly  As directed        Medication List    STOP taking these medications       ADVIL PO      TAKE these medications       acetaminophen 500 MG tablet  Commonly known as:  TYLENOL  Take 500 mg by mouth every 6 (six) hours as needed for fever.     benzonatate 200 MG capsule  Commonly known as:  TESSALON  Take 1 capsule (200 mg total) by mouth 3 (three) times daily.     levofloxacin 750 MG tablet  Commonly known as:  LEVAQUIN  Take 1 tablet (750 mg total) by mouth daily.     MUCINEX DM PO  Take by mouth.     VENTOLIN HFA 108 (90 BASE) MCG/ACT inhaler  Generic drug:  albuterol  Inhale 2 puffs into the lungs every 6 (six) hours as needed for wheezing or shortness of breath.       No Known Allergies     Follow-up Information   Follow up with REDMON,NOELLE, PA-C In 1 week.   Specialty:  Nurse Practitioner   Contact information:   [address], [address] [place] [address] [phone]        The results of significant diagnostics from this hospitalization (including imaging, microbiology, ancillary and laboratory)  are listed below for reference.    Significant Diagnostic Studies: Dg Chest 2 View  03/19/2013   CLINICAL DATA:  Fever, cough.  EXAM: CHEST  2 VIEW  COMPARISON:  None.  FINDINGS: Left lower lobe consolidation and to a lesser extent lingular opacity. Right lung clear. Cardiomediastinal contours within normal range. No pleural effusion or pneumothorax. No acute osseous finding.  IMPRESSION: Left lower lobe/lingular pneumonia.  Recommend radiograph follow up after treatment to document resolution.   Electronically Signed   By: Jearld Lesch M.D.   On: 03/19/2013 06:49   Ct Chest Wo  Contrast  03/21/2013   CLINICAL DATA:  Cough, pneumonia, asthma.  EXAM: CT CHEST WITHOUT CONTRAST  TECHNIQUE: Multidetector CT imaging of the chest was performed following the standard protocol without IV contrast.  COMPARISON:  DG CHEST 2 VIEW dated 03/19/2013  FINDINGS: Hyper attenuating mediastinal and hilar lymph nodes measure up to 10 mm in the lower right paratracheal station. Heart is at the upper limits of normal in size. No pericardial effusion. Sub cm hyper attenuating lymph nodes are seen adjacent to the distal esophagus. Airspace consolidation is seen throughout the left lower lobe with minimal involvement of the lingula. Small left pleural effusion. Scattered, mostly ground-glass, airspace disease in the right lung. Trace right pleural fluid. Airway is unremarkable.  Incidental imaging of the upper abdomen shows calcification in the right adrenal gland. No worrisome lytic or sclerotic lesions.  IMPRESSION: 1. Left lower lobe airspace consolidation with much less severe involvement of the left upper lobe and right lung. Findings are most consistent with multilobar pneumonia. Follow up to clearing is recommended as a centrally obstructing lesion involving the left lower lobe cannot be definitively excluded. Followup could likely be performed with chest radiographs, rather than with CT. 2. Small left pleural effusion, tiny right pleural effusion. 3. Hyperattenuating mediastinal, hilar and periesophageal lymph nodes, together with right adrenal calcification, likely indicative of old granulomatous disease.   Electronically Signed   By: Leanna Battles M.D.   On: 03/21/2013 11:53    Microbiology: Recent Results (from the past 240 hour(s))  CULTURE, BLOOD (ROUTINE X 2)     Status: None   Collection Time    03/19/13  6:57 PM      Result Value Range Status   Specimen Description BLOOD ARM LEFT   Final   Special Requests BOTTLES DRAWN AEROBIC AND ANAEROBIC 10CC   Final   Culture  Setup Time     Final    Value: 03/20/2013 01:16     Performed at [facility]   Culture     Final   Value:        BLOOD CULTURE RECEIVED NO GROWTH TO DATE CULTURE WILL BE HELD FOR 5 DAYS BEFORE ISSUING A FINAL NEGATIVE REPORT     Performed at [facility]   Report Status PENDING   Incomplete  CULTURE, BLOOD (ROUTINE X 2)     Status: None   Collection Time    03/19/13  7:10 PM      Result Value Range Status   Specimen Description BLOOD HAND LEFT   Final   Special Requests BOTTLES DRAWN AEROBIC AND ANAEROBIC 10CC   Final   Culture  Setup Time     Final   Value: 03/20/2013 01:15     Performed at [facility]   Culture     Final   Value:        BLOOD CULTURE RECEIVED NO GROWTH  TO DATE CULTURE WILL BE HELD FOR 5 DAYS BEFORE ISSUING A FINAL NEGATIVE REPORT     [facility]   Report Status PENDING   Incomplete     Labs: Basic Metabolic Panel:  Recent Labs Lab 03/19/13 0622 03/21/13 0635  NA 138 143  K 3.7 3.5*  CL 102 106  CO2 22 24  GLUCOSE 188* 108*  BUN 12 9  CREATININE 1.00 0.91  CALCIUM 8.2* 8.0*   Liver Function Tests:  Recent Labs Lab 03/19/13 0622  AST 19  ALT 18  ALKPHOS 41  BILITOT 0.5  PROT 6.8  ALBUMIN 2.7*   No results found for this basename: LIPASE, AMYLASE,  in the last 168 hours No results found for this basename: AMMONIA,  in the last 168 hours CBC:  Recent Labs Lab 03/19/13 0622 03/21/13 0635  WBC 4.5 8.5  NEUTROABS 3.9  --   HGB 12.9* 11.1*  HCT 37.2* 31.5*  MCV 92.8 91.0  PLT 213 314   Cardiac Enzymes:  Recent Labs Lab 03/19/13 0622  TROPONINI <0.30   BNP: BNP (last 3 results) No results found for this basename: PROBNP,  in the last 8760 hours CBG:  Recent Labs Lab 03/20/13 0641 03/20/13 1618 03/21/13 0612 03/21/13 1759 03/22/13 0751  GLUCAP 147* 144* 120* 111* 93       Signed:  Neiko Trivedi N  [facility] 03/22/2013, 9:42 AM

## 2013-03-26 LAB — CULTURE, BLOOD (ROUTINE X 2)
Culture: NO GROWTH
Culture: NO GROWTH

## 2013-03-27 ENCOUNTER — Emergency Department (HOSPITAL_BASED_OUTPATIENT_CLINIC_OR_DEPARTMENT_OTHER)
Admission: EM | Admit: 2013-03-27 | Discharge: 2013-03-27 | Disposition: A | Discharge status: Home / Self Care | Payer: BC Managed Care – PPO | Attending: Emergency Medicine | Admitting: Emergency Medicine

## 2013-03-27 ENCOUNTER — Encounter (HOSPITAL_BASED_OUTPATIENT_CLINIC_OR_DEPARTMENT_OTHER): Payer: Self-pay | Admitting: Emergency Medicine

## 2013-03-27 ENCOUNTER — Emergency Department (HOSPITAL_BASED_OUTPATIENT_CLINIC_OR_DEPARTMENT_OTHER): Payer: BC Managed Care – PPO

## 2013-03-27 DIAGNOSIS — R000 Tachycardia, unspecified: Secondary | ICD-10-CM | POA: Insufficient documentation

## 2013-03-27 DIAGNOSIS — J45901 Unspecified asthma with (acute) exacerbation: Secondary | ICD-10-CM | POA: Insufficient documentation

## 2013-03-27 DIAGNOSIS — Z8701 Personal history of pneumonia (recurrent): Secondary | ICD-10-CM | POA: Insufficient documentation

## 2013-03-27 DIAGNOSIS — Z79899 Other long term (current) drug therapy: Secondary | ICD-10-CM | POA: Insufficient documentation

## 2013-03-27 DIAGNOSIS — IMO0002 Reserved for concepts with insufficient information to code with codable children: Secondary | ICD-10-CM | POA: Insufficient documentation

## 2013-03-27 DIAGNOSIS — Y929 Unspecified place or not applicable: Secondary | ICD-10-CM | POA: Insufficient documentation

## 2013-03-27 DIAGNOSIS — X58XXXA Exposure to other specified factors, initial encounter: Secondary | ICD-10-CM | POA: Insufficient documentation

## 2013-03-27 DIAGNOSIS — Y939 Activity, unspecified: Secondary | ICD-10-CM | POA: Insufficient documentation

## 2013-03-27 DIAGNOSIS — S39012A Strain of muscle, fascia and tendon of lower back, initial encounter: Secondary | ICD-10-CM

## 2013-03-27 DIAGNOSIS — R5383 Other fatigue: Secondary | ICD-10-CM

## 2013-03-27 DIAGNOSIS — Z87891 Personal history of nicotine dependence: Secondary | ICD-10-CM | POA: Insufficient documentation

## 2013-03-27 DIAGNOSIS — R5381 Other malaise: Secondary | ICD-10-CM | POA: Insufficient documentation

## 2013-03-27 HISTORY — DX: Pneumonia, unspecified organism: J18.9

## 2013-03-27 LAB — URINALYSIS, ROUTINE W REFLEX MICROSCOPIC
Bilirubin Urine: NEGATIVE
Glucose, UA: NEGATIVE mg/dL
Hgb urine dipstick: NEGATIVE
Ketones, ur: NEGATIVE mg/dL
LEUKOCYTES UA: NEGATIVE
Nitrite: NEGATIVE
PH: 6 (ref 5.0–8.0)
Protein, ur: NEGATIVE mg/dL
SPECIFIC GRAVITY, URINE: 1.02 (ref 1.005–1.030)
UROBILINOGEN UA: 0.2 mg/dL (ref 0.0–1.0)

## 2013-03-27 MED ORDER — KETOROLAC TROMETHAMINE 60 MG/2ML IM SOLN
INTRAMUSCULAR | Status: AC
  Filled 2013-03-27: qty 2

## 2013-03-27 MED ORDER — NAPROXEN 375 MG PO TABS: 375.0000 mg | ORAL_TABLET | Freq: Two times a day (BID) | ORAL | Status: DC

## 2013-03-27 MED ORDER — KETOROLAC TROMETHAMINE 60 MG/2ML IM SOLN
60.0000 mg | Freq: Once | INTRAMUSCULAR | Status: AC
  Administered 2013-03-27: 60 mg via INTRAMUSCULAR

## 2013-03-27 MED ORDER — HYDROCODONE-ACETAMINOPHEN 5-325 MG PO TABS: 2.0000 | ORAL_TABLET | ORAL | Status: DC | PRN

## 2013-03-27 NOTE — ED Notes (Signed)
Pain left side of back   Onset yesterday pm

## 2013-03-27 NOTE — Discharge Instructions (Signed)
Back Pain, Adult Low back pain is very common. About 1 in 5 people have back pain.The cause of low back pain is rarely dangerous. The pain often gets better over time.About half of people with a sudden onset of back pain feel better in just 2 weeks. About 8 in 10 people feel better by 6 weeks.  CAUSES Some common causes of back pain include:  Strain of the muscles or ligaments supporting the spine.  Wear and tear (degeneration) of the spinal discs.  Arthritis.  Direct injury to the back. DIAGNOSIS Most of the time, the direct cause of low back pain is not known.However, back pain can be treated effectively even when the exact cause of the pain is unknown.Answering your caregiver's questions about your overall health and symptoms is one of the most accurate ways to make sure the cause of your pain is not dangerous. If your caregiver needs more information, he or she may order lab work or imaging tests (X-rays or MRIs).However, even if imaging tests show changes in your back, this usually does not require surgery. HOME CARE INSTRUCTIONS For many people, back pain returns.Since low back pain is rarely dangerous, it is often a condition that people can learn to manageon their own.   Remain active. It is stressful on the back to sit or stand in one place. Do not sit, drive, or stand in one place for more than 30 minutes at a time. Take short walks on level surfaces as soon as pain allows.Try to increase the length of time you walk each day.  Do not stay in bed.Resting more than 1 or 2 days can delay your recovery.  Do not avoid exercise or work.Your body is made to move.It is not dangerous to be active, even though your back may hurt.Your back will likely heal faster if you return to being active before your pain is gone.  Pay attention to your body when you bend and lift. Many people have less discomfortwhen lifting if they bend their knees, keep the load close to their bodies,and  avoid twisting. Often, the most comfortable positions are those that put less stress on your recovering back.  Find a comfortable position to sleep. Use a firm mattress and lie on your side with your knees slightly bent. If you lie on your back, put a pillow under your knees.  Only take over-the-counter or prescription medicines as directed by your caregiver. Over-the-counter medicines to reduce pain and inflammation are often the most helpful.Your caregiver may prescribe muscle relaxant drugs.These medicines help dull your pain so you can more quickly return to your normal activities and healthy exercise.  Put ice on the injured area.  Put ice in a plastic bag.  Place a towel between your skin and the bag.  Leave the ice on for 15-20 minutes, 03-04 times a day for the first 2 to 3 days. After that, ice and heat may be alternated to reduce pain and spasms.  Ask your caregiver about trying back exercises and gentle massage. This may be of some benefit.  Avoid feeling anxious or stressed.Stress increases muscle tension and can worsen back pain.It is important to recognize when you are anxious or stressed and learn ways to manage it.Exercise is a great option. SEEK MEDICAL CARE IF:  You have pain that is not relieved with rest or medicine.  You have pain that does not improve in 1 week.  You have new symptoms.  You are generally not feeling well. SEEK   IMMEDIATE MEDICAL CARE IF:   You have pain that radiates from your back into your legs.  You develop new bowel or bladder control problems.  You have unusual weakness or numbness in your arms or legs.  You develop nausea or vomiting.  You develop abdominal pain.  You feel faint. Document Released: 02/08/2005 Document Revised: 08/10/2011 Document Reviewed: 06/29/2010 ExitCare Patient Information 2014 [employer].  

## 2013-03-27 NOTE — ED Provider Notes (Signed)
CSN: [id number]     Arrival date & time 03/27/13  0136 History   First MD Initiated Contact with Patient 03/27/13 0153     Chief Complaint  Patient presents with  . Back Pain   (Consider location/radiation/quality/duration/timing/severity/associated sxs/prior Treatment) HPI Comments: Patient presents with left upper back pain. He was recently admitted for pneumonia. He states overall he feels like his pneumonia is better. He was previously having shortness of breath which he states is improved. He does have a lot of coughing still. He states that during the coughing episode he started having some left mid back pain. This started about 3 days ago. It's been ongoing since that time. It's worse with coughing and worse with movement. He states it's worse with certain positions and worse with bending over. There's no radiation down his legs. He denies any numbness or weakness in his legs. He denies any radiation to his abdomen. He denies any urinary symptoms. There does not appear to be any pleuritic component to the pain.  Patient is a 43 y.o. male presenting with back pain.  Back Pain Associated symptoms: no abdominal pain, no chest pain, no fever, no headaches, no numbness and no weakness     Past Medical History  Diagnosis Date  . Asthma   . Pneumonia    History reviewed. No pertinent past surgical history. History reviewed. No pertinent family history. History  Substance Use Topics  . Smoking status: Former Games developer  . Smokeless tobacco: Not on file  . Alcohol Use: Yes    Review of Systems  Constitutional: Positive for fatigue. Negative for fever, chills and diaphoresis.  HENT: Negative for congestion, rhinorrhea and sneezing.   Eyes: Negative.   Respiratory: Positive for cough. Negative for chest tightness and shortness of breath.   Cardiovascular: Negative for chest pain and leg swelling.  Gastrointestinal: Negative for nausea, vomiting, abdominal pain, diarrhea and blood in stool.   Genitourinary: Negative for frequency, hematuria, flank pain and difficulty urinating.  Musculoskeletal: Positive for back pain. Negative for arthralgias.  Skin: Negative for rash.  Neurological: Negative for dizziness, speech difficulty, weakness, numbness and headaches.    Allergies  Review of patient's allergies indicates no known allergies.  Home Medications   Current Outpatient Rx  Name  Route  Sig  Dispense  Refill  . acetaminophen (TYLENOL) 500 MG tablet   Oral   Take 500 mg by mouth every 6 (six) hours as needed for fever.         . benzonatate (TESSALON) 200 MG capsule   Oral   Take 1 capsule (200 mg total) by mouth 3 (three) times daily.   20 capsule   0   . HYDROcodone-acetaminophen (NORCO/VICODIN) 5-325 MG per tablet   Oral   Take 2 tablets by mouth every 4 (four) hours as needed for moderate pain.   15 tablet   0   . naproxen (NAPROSYN) 375 MG tablet   Oral   Take 1 tablet (375 mg total) by mouth 2 (two) times daily.   20 tablet   0    BP 136/100  Pulse 103  Temp(Src) 98.3 F (36.8 C) (Oral)  SpO2 95% Physical Exam  Constitutional: He is oriented to person, place, and time. He appears well-developed and well-nourished.  HENT:  Head: Normocephalic and atraumatic.  Eyes: Pupils are equal, round, and reactive to light.  Neck: Normal range of motion. Neck supple.  Cardiovascular: Normal rate, regular rhythm and normal heart sounds.   Pulmonary/Chest: Effort  normal and breath sounds normal. No respiratory distress. He has no wheezes. He has no rales. He exhibits no tenderness.  Abdominal: Soft. Bowel sounds are normal. There is no tenderness. There is no rebound and no guarding.  Musculoskeletal: Normal range of motion. He exhibits no edema.  +TTP along musculature in left mid/lower lumbar spine.  No pain along ribs.  No pain along spine.  Neg SLR bilaterally.   Lymphadenopathy:    He has no cervical adenopathy.  Neurological: He is alert and oriented  to person, place, and time.  Normal sensation/motor function to the legs  Skin: Skin is warm and dry. No rash noted.  Psychiatric: He has a normal mood and affect.    ED Course  Procedures (including critical care time) Labs Review Labs Reviewed  URINALYSIS, ROUTINE W REFLEX MICROSCOPIC   Imaging Review Dg Chest 2 View  03/27/2013   CLINICAL DATA:  History of recent pneumonia.  Left back pain.  EXAM: CHEST  2 VIEW  COMPARISON:  03/19/2013.  FINDINGS: Partial interval clearing of left lower lobe predominant pneumonia. There is a small left effusion. No evidence of cavitation or air leak. Normal heart size.  IMPRESSION: Partial clearing of left lower lobe pneumonia. Tiny left pleural effusion.   Electronically Signed   By: Tiburcio PeaJonathan  Watts M.D.   On: 03/27/2013 02:31    EKG Interpretation   None       MDM   1. Back strain    Patient presents with low back pain. It seems to be musculoskeletal in nature. It's worse with movement and coughing. He has improving symptoms from his pneumonia. There's no worsening shortness of breath, persistent tachycardia, or hypoxia that would be more suggestive of pulmonary embolus. His repeat vital signs show heart rate of 98 and oxygen saturation of 96% on room air. He was given a prescription for Vicodin and Naprosyn to use at home. He was encouraged to followup with his primary care physician if his symptoms are not improving or return here as needed for any worsening symptoms.    Rolan BuccoMelanie Shaterra Sanzone, MD 03/27/13 [phone]

## 2013-04-23 ENCOUNTER — Ambulatory Visit
Admission: RE | Admit: 2013-04-23 | Discharge: 2013-04-23 | Disposition: A | Discharge status: Home / Self Care | Payer: BC Managed Care – PPO | Source: Ambulatory Visit | Attending: Physician Assistant | Admitting: Physician Assistant

## 2013-04-23 ENCOUNTER — Other Ambulatory Visit: Payer: Self-pay | Admitting: Physician Assistant

## 2013-04-23 DIAGNOSIS — J189 Pneumonia, unspecified organism: Secondary | ICD-10-CM

## 2014-05-23 ENCOUNTER — Other Ambulatory Visit: Payer: Self-pay | Admitting: Otolaryngology

## 2014-05-23 DIAGNOSIS — J329 Chronic sinusitis, unspecified: Secondary | ICD-10-CM

## 2014-05-31 ENCOUNTER — Ambulatory Visit
Admission: RE | Admit: 2014-05-31 | Discharge: 2014-05-31 | Disposition: A | Discharge status: Home / Self Care | Payer: BLUE CROSS/BLUE SHIELD | Source: Ambulatory Visit | Attending: Otolaryngology | Admitting: Otolaryngology

## 2014-05-31 DIAGNOSIS — J329 Chronic sinusitis, unspecified: Secondary | ICD-10-CM

## 2016-03-08 DIAGNOSIS — J069 Acute upper respiratory infection, unspecified: Secondary | ICD-10-CM | POA: Diagnosis not present

## 2016-03-08 DIAGNOSIS — R05 Cough: Secondary | ICD-10-CM | POA: Diagnosis not present

## 2016-06-02 DIAGNOSIS — Z131 Encounter for screening for diabetes mellitus: Secondary | ICD-10-CM | POA: Diagnosis not present

## 2016-06-02 DIAGNOSIS — Z13220 Encounter for screening for lipoid disorders: Secondary | ICD-10-CM | POA: Diagnosis not present

## 2016-06-02 DIAGNOSIS — Z0000 Encounter for general adult medical examination without abnormal findings: Secondary | ICD-10-CM | POA: Diagnosis not present

## 2016-10-22 DIAGNOSIS — M1711 Unilateral primary osteoarthritis, right knee: Secondary | ICD-10-CM | POA: Diagnosis not present

## 2017-03-09 DIAGNOSIS — M1711 Unilateral primary osteoarthritis, right knee: Secondary | ICD-10-CM | POA: Diagnosis not present

## 2017-03-29 DIAGNOSIS — G8918 Other acute postprocedural pain: Secondary | ICD-10-CM | POA: Diagnosis not present

## 2017-03-29 DIAGNOSIS — M23331 Other meniscus derangements, other medial meniscus, right knee: Secondary | ICD-10-CM | POA: Diagnosis not present

## 2017-03-29 DIAGNOSIS — M94261 Chondromalacia, right knee: Secondary | ICD-10-CM | POA: Diagnosis not present

## 2017-03-29 DIAGNOSIS — M1711 Unilateral primary osteoarthritis, right knee: Secondary | ICD-10-CM | POA: Diagnosis not present

## 2017-03-29 DIAGNOSIS — S83231A Complex tear of medial meniscus, current injury, right knee, initial encounter: Secondary | ICD-10-CM | POA: Diagnosis not present

## 2017-04-05 DIAGNOSIS — M25561 Pain in right knee: Secondary | ICD-10-CM | POA: Diagnosis not present

## 2017-04-06 DIAGNOSIS — R0789 Other chest pain: Secondary | ICD-10-CM | POA: Diagnosis not present

## 2017-04-11 DIAGNOSIS — M25561 Pain in right knee: Secondary | ICD-10-CM | POA: Diagnosis not present

## 2017-04-14 DIAGNOSIS — M25561 Pain in right knee: Secondary | ICD-10-CM | POA: Diagnosis not present

## 2017-04-19 DIAGNOSIS — M25561 Pain in right knee: Secondary | ICD-10-CM | POA: Diagnosis not present

## 2017-04-21 DIAGNOSIS — M25561 Pain in right knee: Secondary | ICD-10-CM | POA: Diagnosis not present

## 2017-04-26 DIAGNOSIS — M25561 Pain in right knee: Secondary | ICD-10-CM | POA: Diagnosis not present

## 2017-06-03 DIAGNOSIS — Z0000 Encounter for general adult medical examination without abnormal findings: Secondary | ICD-10-CM | POA: Diagnosis not present

## 2017-06-03 DIAGNOSIS — Z13220 Encounter for screening for lipoid disorders: Secondary | ICD-10-CM | POA: Diagnosis not present

## 2017-12-08 DIAGNOSIS — J329 Chronic sinusitis, unspecified: Secondary | ICD-10-CM | POA: Diagnosis not present

## 2017-12-14 ENCOUNTER — Other Ambulatory Visit: Payer: Self-pay | Admitting: Otolaryngology

## 2017-12-14 DIAGNOSIS — J329 Chronic sinusitis, unspecified: Secondary | ICD-10-CM

## 2017-12-20 ENCOUNTER — Ambulatory Visit
Admission: RE | Admit: 2017-12-20 | Discharge: 2017-12-20 | Disposition: A | Discharge status: Home / Self Care | Payer: BLUE CROSS/BLUE SHIELD | Source: Ambulatory Visit | Attending: Otolaryngology | Admitting: Otolaryngology

## 2017-12-20 DIAGNOSIS — J329 Chronic sinusitis, unspecified: Secondary | ICD-10-CM | POA: Diagnosis not present

## 2018-02-01 ENCOUNTER — Ambulatory Visit: Payer: Self-pay | Admitting: Otolaryngology

## 2018-02-01 NOTE — H&P (Signed)
PREOPERATIVE H&P  Chief Complaint: history of recurrent chronic sinus disease  HPI: Jesse Berg is a 47 y.o. male who presents for evaluation of chronic sinusitis. Patient has history of allergies as well as frequent and chronic sinus problems. He has been on several rounds of antibiotics. CT scan demonstrated chronic sinus disease. Right side has been worse than left side.Patient is taken to the operating room this time for FESS and turbinate reductions.  Past Medical History:  Diagnosis Date  . Asthma   . Pneumonia    No past surgical history on file. Social History   Socioeconomic History  . Marital status: Married    Spouse name: Not on file  . Number of children: Not on file  . Years of education: Not on file  . Highest education level: Not on file  Occupational History  . Not on file  Social Needs  . Financial resource strain: Not on file  . Food insecurity:    Worry: Not on file    Inability: Not on file  . Transportation needs:    Medical: Not on file    Non-medical: Not on file  Tobacco Use  . Smoking status: Former Smoker  Substance and Sexual Activity  . Alcohol use: Yes  . Drug use: No  . Sexual activity: Not on file  Lifestyle  . Physical activity:    Days per week: Not on file    Minutes per session: Not on file  . Stress: Not on file  Relationships  . Social connections:    Talks on phone: Not on file    Gets together: Not on file    Attends religious service: Not on file    Active member of club or organization: Not on file    Attends meetings of clubs or organizations: Not on file    Relationship status: Not on file  Other Topics Concern  . Not on file  Social History Narrative  . Not on file   No family history on file. No Known Allergies Prior to Admission medications   Medication Sig Start Date End Date Taking? Authorizing Provider  acetaminophen (TYLENOL) 500 MG tablet Take 500 mg by mouth every 6 (six) hours as needed for fever.     [provider]  benzonatate (TESSALON) 200 MG capsule Take 1 capsule (200 mg total) by mouth 3 (three) times daily. 03/22/13   Esperanza SheetsBuriev, Ulugbek N, MD  HYDROcodone-acetaminophen (NORCO/VICODIN) 5-325 MG per tablet Take 2 tablets by mouth every 4 (four) hours as needed for moderate pain. 03/27/13   Rolan BuccoBelfi, Melanie, MD  naproxen (NAPROSYN) 375 MG tablet Take 1 tablet (375 mg total) by mouth 2 (two) times daily. 03/27/13   Rolan BuccoBelfi, Melanie, MD     Positive ROS: frequent sinus infections  All other systems have been reviewed and were otherwise negative with the exception of those mentioned in the HPI and as above.  Physical Exam: There were no vitals filed for this visit.  General: Alert, no acute distress Oral: Normal oral mucosa and tonsils Nasal: mild septal deformity to the right. Turbinate hypertrophy. Middle meatus edematous bilaterally Neck: No palpable adenopathy or thyroid nodules Ear: Ear canal is clear with normal appearing TMs Cardiovascular: Regular rate and rhythm, no murmur.  Respiratory: Clear to auscultation Neurologic: Alert and oriented x 3   Assessment/Plan: CHRONIC FRONTAL SINUSITIS, HYPERTROPHIC TURBINATES, CHRONIC MAXILLARY SINUSITIS Plan for Procedure(s): BILATERAL TURBINATE REDUCTION MAXILLARY ANTROSTOMY WITH TISSUE REMOVAL TOTAL ETHMOIDECTOMY SINUS ENDO WITH FUSION   Cristal Deerhristopher  Ezzard Standing, MD 02/01/2018 12:16 PM

## 2018-02-02 ENCOUNTER — Other Ambulatory Visit: Payer: Self-pay

## 2018-02-02 ENCOUNTER — Encounter (HOSPITAL_BASED_OUTPATIENT_CLINIC_OR_DEPARTMENT_OTHER): Payer: Self-pay | Admitting: *Deleted

## 2018-02-10 ENCOUNTER — Ambulatory Visit (HOSPITAL_BASED_OUTPATIENT_CLINIC_OR_DEPARTMENT_OTHER): Payer: Commercial Managed Care - PPO | Admitting: Certified Registered"

## 2018-02-10 ENCOUNTER — Encounter (HOSPITAL_BASED_OUTPATIENT_CLINIC_OR_DEPARTMENT_OTHER): Payer: Self-pay | Admitting: Anesthesiology

## 2018-02-10 ENCOUNTER — Other Ambulatory Visit: Payer: Self-pay

## 2018-02-10 ENCOUNTER — Ambulatory Visit (HOSPITAL_BASED_OUTPATIENT_CLINIC_OR_DEPARTMENT_OTHER)
Admission: RE | Admit: 2018-02-10 | Discharge: 2018-02-10 | Disposition: A | Discharge status: Home / Self Care | Payer: Commercial Managed Care - PPO | Attending: Otolaryngology | Admitting: Otolaryngology

## 2018-02-10 ENCOUNTER — Encounter (HOSPITAL_BASED_OUTPATIENT_CLINIC_OR_DEPARTMENT_OTHER)
Admission: RE | Disposition: A | Discharge status: Home / Self Care | Payer: Self-pay | Source: Home / Self Care | Attending: Otolaryngology

## 2018-02-10 DIAGNOSIS — Z87891 Personal history of nicotine dependence: Secondary | ICD-10-CM | POA: Diagnosis not present

## 2018-02-10 DIAGNOSIS — J320 Chronic maxillary sinusitis: Secondary | ICD-10-CM | POA: Diagnosis not present

## 2018-02-10 DIAGNOSIS — J343 Hypertrophy of nasal turbinates: Secondary | ICD-10-CM | POA: Insufficient documentation

## 2018-02-10 DIAGNOSIS — J329 Chronic sinusitis, unspecified: Secondary | ICD-10-CM | POA: Diagnosis not present

## 2018-02-10 DIAGNOSIS — J322 Chronic ethmoidal sinusitis: Secondary | ICD-10-CM | POA: Diagnosis not present

## 2018-02-10 DIAGNOSIS — Z791 Long term (current) use of non-steroidal anti-inflammatories (NSAID): Secondary | ICD-10-CM | POA: Insufficient documentation

## 2018-02-10 DIAGNOSIS — J45909 Unspecified asthma, uncomplicated: Secondary | ICD-10-CM | POA: Insufficient documentation

## 2018-02-10 DIAGNOSIS — Z79899 Other long term (current) drug therapy: Secondary | ICD-10-CM | POA: Diagnosis not present

## 2018-02-10 DIAGNOSIS — J321 Chronic frontal sinusitis: Secondary | ICD-10-CM | POA: Insufficient documentation

## 2018-02-10 HISTORY — PX: SINUS ENDO WITH FUSION: SHX5329

## 2018-02-10 HISTORY — PX: MAXILLARY ANTROSTOMY: SHX2003

## 2018-02-10 HISTORY — PX: ETHMOIDECTOMY: SHX5197

## 2018-02-10 HISTORY — PX: TURBINATE REDUCTION: SHX6157

## 2018-02-10 SURGERY — REDUCTION, NASAL TURBINATE
Anesthesia: General | Site: Nose | Laterality: Bilateral

## 2018-02-10 MED ORDER — PROPOFOL 10 MG/ML IV BOLUS
INTRAVENOUS | Status: AC
  Filled 2018-02-10: qty 20

## 2018-02-10 MED ORDER — CHLORHEXIDINE GLUCONATE CLOTH 2 % EX PADS: 6.0000 | MEDICATED_PAD | Freq: Once | CUTANEOUS | Status: DC

## 2018-02-10 MED ORDER — LIDOCAINE-EPINEPHRINE 1 %-1:100000 IJ SOLN
INTRAMUSCULAR | Status: DC | PRN
  Administered 2018-02-10: 11 mL

## 2018-02-10 MED ORDER — SUGAMMADEX SODIUM 500 MG/5ML IV SOLN
INTRAVENOUS | Status: AC
  Filled 2018-02-10: qty 5

## 2018-02-10 MED ORDER — MUPIROCIN 2 % EX OINT
TOPICAL_OINTMENT | CUTANEOUS | Status: AC
  Filled 2018-02-10: qty 22

## 2018-02-10 MED ORDER — HYDROCODONE-ACETAMINOPHEN 5-325 MG PO TABS
ORAL_TABLET | ORAL | Status: AC
  Filled 2018-02-10: qty 1

## 2018-02-10 MED ORDER — FENTANYL CITRATE (PF) 250 MCG/5ML IJ SOLN
INTRAMUSCULAR | Status: DC | PRN
  Administered 2018-02-10: 100 ug via INTRAVENOUS
  Administered 2018-02-10: 50 ug via INTRAVENOUS

## 2018-02-10 MED ORDER — MEPERIDINE HCL 25 MG/ML IJ SOLN: 6.2500 mg | INTRAMUSCULAR | Status: DC | PRN

## 2018-02-10 MED ORDER — DEXAMETHASONE SODIUM PHOSPHATE 10 MG/ML IJ SOLN
INTRAMUSCULAR | Status: AC
  Filled 2018-02-10: qty 2

## 2018-02-10 MED ORDER — ONDANSETRON HCL 4 MG/2ML IJ SOLN: 4.0000 mg | Freq: Once | INTRAMUSCULAR | Status: DC | PRN

## 2018-02-10 MED ORDER — ROCURONIUM BROMIDE 10 MG/ML (PF) SYRINGE
PREFILLED_SYRINGE | INTRAVENOUS | Status: DC | PRN
  Administered 2018-02-10: 50 mg via INTRAVENOUS

## 2018-02-10 MED ORDER — CEFAZOLIN SODIUM-DEXTROSE 2-4 GM/100ML-% IV SOLN
2.0000 g | INTRAVENOUS | Status: AC
  Administered 2018-02-10: 2 g via INTRAVENOUS

## 2018-02-10 MED ORDER — LIDOCAINE-EPINEPHRINE 1 %-1:100000 IJ SOLN
INTRAMUSCULAR | Status: AC
  Filled 2018-02-10: qty 1

## 2018-02-10 MED ORDER — HYDROCODONE-ACETAMINOPHEN 5-325 MG PO TABS: 1.0000 | ORAL_TABLET | Freq: Four times a day (QID) | ORAL | 0 refills | Status: AC | PRN

## 2018-02-10 MED ORDER — CEPHALEXIN 500 MG PO CAPS: 500.0000 mg | ORAL_CAPSULE | Freq: Two times a day (BID) | ORAL | 0 refills | Status: AC

## 2018-02-10 MED ORDER — OXYMETAZOLINE HCL 0.05 % NA SOLN
NASAL | Status: DC | PRN
  Administered 2018-02-10: 1 via TOPICAL

## 2018-02-10 MED ORDER — OXYMETAZOLINE HCL 0.05 % NA SOLN
NASAL | Status: AC
  Filled 2018-02-10: qty 15

## 2018-02-10 MED ORDER — DEXMEDETOMIDINE HCL IN NACL 200 MCG/50ML IV SOLN
INTRAVENOUS | Status: DC | PRN
  Administered 2018-02-10: 16 ug via INTRAVENOUS
  Administered 2018-02-10 (×3): 8 ug via INTRAVENOUS

## 2018-02-10 MED ORDER — ONDANSETRON HCL 4 MG/2ML IJ SOLN
INTRAMUSCULAR | Status: DC | PRN
  Administered 2018-02-10: 4 mg via INTRAVENOUS

## 2018-02-10 MED ORDER — FENTANYL CITRATE (PF) 100 MCG/2ML IJ SOLN
INTRAMUSCULAR | Status: AC
  Filled 2018-02-10: qty 2

## 2018-02-10 MED ORDER — SCOPOLAMINE 1 MG/3DAYS TD PT72
1.0000 | MEDICATED_PATCH | Freq: Once | TRANSDERMAL | Status: DC | PRN
  Administered 2018-02-10: 1.5 mg via TRANSDERMAL

## 2018-02-10 MED ORDER — ROCURONIUM BROMIDE 50 MG/5ML IV SOSY
PREFILLED_SYRINGE | INTRAVENOUS | Status: AC
  Filled 2018-02-10: qty 10

## 2018-02-10 MED ORDER — MIDAZOLAM HCL 2 MG/2ML IJ SOLN
INTRAMUSCULAR | Status: DC | PRN
  Administered 2018-02-10: 2 mg via INTRAVENOUS

## 2018-02-10 MED ORDER — HYDROCODONE-ACETAMINOPHEN 5-325 MG PO TABS
1.0000 | ORAL_TABLET | Freq: Once | ORAL | Status: AC
  Administered 2018-02-10: 1 via ORAL

## 2018-02-10 MED ORDER — PROPOFOL 10 MG/ML IV BOLUS
INTRAVENOUS | Status: DC | PRN
  Administered 2018-02-10: 180 mg via INTRAVENOUS

## 2018-02-10 MED ORDER — MIDAZOLAM HCL 2 MG/2ML IJ SOLN
INTRAMUSCULAR | Status: AC
  Filled 2018-02-10: qty 2

## 2018-02-10 MED ORDER — SUGAMMADEX SODIUM 200 MG/2ML IV SOLN
INTRAVENOUS | Status: DC | PRN
  Administered 2018-02-10 (×2): 100 mg via INTRAVENOUS

## 2018-02-10 MED ORDER — SODIUM CHLORIDE 0.9 % IV SOLN
INTRAVENOUS | Status: AC | PRN
  Administered 2018-02-10: 475 mL

## 2018-02-10 MED ORDER — LACTATED RINGERS IV SOLN
INTRAVENOUS | Status: DC
  Administered 2018-02-10 (×2): via INTRAVENOUS

## 2018-02-10 MED ORDER — MUPIROCIN 2 % EX OINT
TOPICAL_OINTMENT | CUTANEOUS | Status: DC | PRN
  Administered 2018-02-10: 1 via NASAL

## 2018-02-10 MED ORDER — HYDROCODONE-ACETAMINOPHEN 7.5-325 MG PO TABS: 1.0000 | ORAL_TABLET | Freq: Once | ORAL | Status: DC | PRN

## 2018-02-10 MED ORDER — LIDOCAINE 2% (20 MG/ML) 5 ML SYRINGE
INTRAMUSCULAR | Status: AC
  Filled 2018-02-10: qty 10

## 2018-02-10 MED ORDER — FENTANYL CITRATE (PF) 100 MCG/2ML IJ SOLN: 50.0000 ug | INTRAMUSCULAR | Status: DC | PRN

## 2018-02-10 MED ORDER — DEXAMETHASONE SODIUM PHOSPHATE 10 MG/ML IJ SOLN
INTRAMUSCULAR | Status: DC | PRN
  Administered 2018-02-10: 10 mg via INTRAVENOUS

## 2018-02-10 MED ORDER — SCOPOLAMINE 1 MG/3DAYS TD PT72
MEDICATED_PATCH | TRANSDERMAL | Status: AC
  Filled 2018-02-10: qty 1

## 2018-02-10 MED ORDER — BACITRACIN ZINC 500 UNIT/GM EX OINT
TOPICAL_OINTMENT | CUTANEOUS | Status: AC
  Filled 2018-02-10: qty 28.35

## 2018-02-10 MED ORDER — MIDAZOLAM HCL 2 MG/2ML IJ SOLN: 1.0000 mg | INTRAMUSCULAR | Status: DC | PRN

## 2018-02-10 MED ORDER — HYDROMORPHONE HCL 1 MG/ML IJ SOLN: 0.2500 mg | INTRAMUSCULAR | Status: DC | PRN

## 2018-02-10 MED ORDER — CEFAZOLIN SODIUM-DEXTROSE 2-4 GM/100ML-% IV SOLN
INTRAVENOUS | Status: AC
  Filled 2018-02-10: qty 100

## 2018-02-10 MED ORDER — ONDANSETRON HCL 4 MG/2ML IJ SOLN
INTRAMUSCULAR | Status: AC
  Filled 2018-02-10: qty 4

## 2018-02-10 MED ORDER — LIDOCAINE 2% (20 MG/ML) 5 ML SYRINGE
INTRAMUSCULAR | Status: DC | PRN
  Administered 2018-02-10: 100 mg via INTRAVENOUS

## 2018-02-10 MED ORDER — SUCCINYLCHOLINE CHLORIDE 200 MG/10ML IV SOSY
PREFILLED_SYRINGE | INTRAVENOUS | Status: DC | PRN
  Administered 2018-02-10: 100 mg via INTRAVENOUS

## 2018-02-10 SURGICAL SUPPLY — 78 items
APL SWBSTK 6 STRL LF DISP (MISCELLANEOUS)
APPLICATOR COTTON TIP 6 STRL (MISCELLANEOUS) IMPLANT
APPLICATOR COTTON TIP 6IN STRL (MISCELLANEOUS)
ATTRACTOMAT 16X20 MAGNETIC DRP (DRAPES) ×1 IMPLANT
BALLN SINUPLASTY KIT 6X16 (BALLOONS)
BALLOON SINUPLASTY KIT 6X16 (BALLOONS) IMPLANT
BLADE INF TURB ROT M4 2 5PK (BLADE) IMPLANT
BLADE RAD40 ROTATE 4M 4 5PK (BLADE) IMPLANT
BLADE RAD60 ROTATE M4 4 5PK (BLADE) IMPLANT
BLADE ROTATE RAD 12 4 M4 (BLADE) IMPLANT
BLADE ROTATE RAD 40 4 M4 (BLADE) IMPLANT
BLADE ROTATE TRICUT 4X13 M4 (BLADE) ×2 IMPLANT
BLADE SURG 15 STRL LF DISP TIS (BLADE) ×1 IMPLANT
BLADE SURG 15 STRL SS (BLADE)
BLADE TRICUT ROTATE M4 4 5PK (BLADE) IMPLANT
BUR HS RAD FRONTAL 3 (BURR) IMPLANT
CANISTER SUC SOCK COL 7IN (MISCELLANEOUS) ×4 IMPLANT
CANISTER SUCT 1200ML W/VALVE (MISCELLANEOUS) ×2 IMPLANT
CLEANER CAUTERY TIP 5X5 PAD (MISCELLANEOUS) IMPLANT
COAGULATOR SUCT 8FR VV (MISCELLANEOUS) IMPLANT
COVER MAYO STAND STRL (DRAPES) ×1 IMPLANT
COVER PROBE W GEL 5X96 (DRAPES) IMPLANT
COVER WAND RF STERILE (DRAPES) IMPLANT
DECANTER SPIKE VIAL GLASS SM (MISCELLANEOUS) IMPLANT
DEVICE INFLATION SEID (MISCELLANEOUS) IMPLANT
DRAPE SURG 17X23 STRL (DRAPES) ×2 IMPLANT
DRESSING ADAPTIC 1/2  N-ADH (PACKING) IMPLANT
DRESSING NASAL KENNEDY 3.5X.9 (MISCELLANEOUS) IMPLANT
DRSG NASAL KENNEDY 3.5X.9 (MISCELLANEOUS)
DRSG NASAL KENNEDY LMNT 8CM (GAUZE/BANDAGES/DRESSINGS) IMPLANT
DRSG NASOPORE 8CM (GAUZE/BANDAGES/DRESSINGS) IMPLANT
DRSG TELFA 3X8 NADH (GAUZE/BANDAGES/DRESSINGS) IMPLANT
ELECT COATED BLADE 2.86 ST (ELECTRODE) IMPLANT
ELECT NDL BLADE 2-5/6 (NEEDLE) IMPLANT
ELECT NEEDLE BLADE 2-5/6 (NEEDLE) IMPLANT
ELECT REM PT RETURN 9FT ADLT (ELECTROSURGICAL) ×2
ELECTRODE REM PT RTRN 9FT ADLT (ELECTROSURGICAL) IMPLANT
GLOVE BIOGEL PI IND STRL 7.0 (GLOVE) IMPLANT
GLOVE BIOGEL PI INDICATOR 7.0 (GLOVE) ×1
GLOVE SURG SS PI 6.5 STRL IVOR (GLOVE) ×3 IMPLANT
GLOVE SURG SS PI 7.5 STRL IVOR (GLOVE) ×2 IMPLANT
GOWN STRL REUS W/ TWL LRG LVL3 (GOWN DISPOSABLE) ×2 IMPLANT
GOWN STRL REUS W/ TWL XL LVL3 (GOWN DISPOSABLE) ×1 IMPLANT
GOWN STRL REUS W/TWL LRG LVL3 (GOWN DISPOSABLE) ×4
GOWN STRL REUS W/TWL XL LVL3 (GOWN DISPOSABLE) ×2
HEMOSTAT SURGICEL .5X2 ABSORB (HEMOSTASIS) IMPLANT
HEMOSTAT SURGICEL 2X14 (HEMOSTASIS) IMPLANT
IV NS 1000ML (IV SOLUTION)
IV NS 1000ML BAXH (IV SOLUTION) IMPLANT
IV NS 500ML (IV SOLUTION)
IV NS 500ML BAXH (IV SOLUTION) ×1 IMPLANT
NDL PRECISIONGLIDE 27X1.5 (NEEDLE) ×1 IMPLANT
NDL SPNL 25GX3.5 QUINCKE BL (NEEDLE) IMPLANT
NEEDLE PRECISIONGLIDE 27X1.5 (NEEDLE) ×2 IMPLANT
NEEDLE SPNL 25GX3.5 QUINCKE BL (NEEDLE) IMPLANT
NS IRRIG 1000ML POUR BTL (IV SOLUTION) ×2 IMPLANT
PACK BASIN DAY SURGERY FS (CUSTOM PROCEDURE TRAY) ×2 IMPLANT
PACK ENT DAY SURGERY (CUSTOM PROCEDURE TRAY) ×2 IMPLANT
PAD CLEANER CAUTERY TIP 5X5 (MISCELLANEOUS)
PAD DRESSING TELFA 3X8 NADH (GAUZE/BANDAGES/DRESSINGS) IMPLANT
PATTIES SURGICAL .5 X3 (DISPOSABLE) ×2 IMPLANT
PENCIL BUTTON HOLSTER BLD 10FT (ELECTRODE) IMPLANT
SHEET MEDIUM DRAPE 40X70 STRL (DRAPES) ×2 IMPLANT
SLEEVE SCD COMPRESS KNEE MED (MISCELLANEOUS) ×2 IMPLANT
SOLUTION ANTI FOG 6CC (MISCELLANEOUS) ×2 IMPLANT
SPONGE GAUZE 2X2 8PLY STRL LF (GAUZE/BANDAGES/DRESSINGS) ×2 IMPLANT
SUT CHROMIC 4 0 PS 2 18 (SUTURE) IMPLANT
SUT ETHILON 3 0 PS 1 (SUTURE) IMPLANT
SUT SILK 2 0 PERMA HAND 18 BK (SUTURE) IMPLANT
SYR 3ML 18GX1 1/2 (SYRINGE) IMPLANT
SYR CONTROL 10ML LL (SYRINGE) IMPLANT
TOWEL GREEN STERILE FF (TOWEL DISPOSABLE) ×4 IMPLANT
TRACKER ENT INSTRUMENT (MISCELLANEOUS) ×2 IMPLANT
TRACKER ENT PATIENT (MISCELLANEOUS) ×2 IMPLANT
TRAY DSU PREP LF (CUSTOM PROCEDURE TRAY) ×2 IMPLANT
TUBE CONNECTING 20X1/4 (TUBING) ×2 IMPLANT
TUBING STRAIGHTSHOT EPS 5PK (TUBING) ×2 IMPLANT
YANKAUER SUCT BULB TIP NO VENT (SUCTIONS) ×2 IMPLANT

## 2018-02-10 NOTE — Anesthesia Procedure Notes (Signed)
Procedure Name: Intubation Date/Time: 02/10/2018 8:54 AM Performed by: White, Amedeo Plenty, CRNA Pre-anesthesia Checklist: Patient identified, Emergency Drugs available, Suction available and Patient being monitored Patient Re-evaluated:Patient Re-evaluated prior to induction Oxygen Delivery Method: Circle System Utilized Preoxygenation: Pre-oxygenation with 100% oxygen Induction Type: IV induction Laryngoscope Size: Mac and 4 Grade View: Grade I Tube type: Oral Tube size: 7.0 mm Number of attempts: 1 Airway Equipment and Method: Stylet and Oral airway Placement Confirmation: ETT inserted through vocal cords under direct vision,  positive ETCO2 and breath sounds checked- equal and bilateral Secured at: 22 cm Tube secured with: Tape Dental Injury: Teeth and Oropharynx as per pre-operative assessment

## 2018-02-10 NOTE — Brief Op Note (Signed)
02/10/2018  11:02 AM  PATIENT:  Jesse Berg  47 y.o. male  PRE-OPERATIVE DIAGNOSIS:  CHRONIC SINUSITIS, HYPERTROPHIC TURBINATES, CHRONIC MAXILLARY SINUSITIS  POST-OPERATIVE DIAGNOSIS:  CHRONIC  SINUSITIS, HYPERTROPHIC TURBINATES, CHRONIC MAXILLARY SINUSITI  PROCEDURE:  Procedure(s): BILATERAL TURBINATE REDUCTION (Bilateral) MAXILLARY ANTROSTOMY WITH TISSUE REMOVAL (Bilateral) TOTAL ETHMOIDECTOMY (N/A) SINUS ENDO WITH FUSION (N/A)  SURGEON:  Surgeon(s) and Role:    Drema Halon* Newman, Christopher E, MD - Primary  PHYSICIAN ASSISTANT:   ASSISTANTS: none   ANESTHESIA:   general  EBL:  80 cc   BLOOD ADMINISTERED:none  DRAINS: none   LOCAL MEDICATIONS USED:  XYLOCAINE with EPI  12 cc  SPECIMEN:  No Specimen  DISPOSITION OF SPECIMEN:  N/A  COUNTS:  YES  TOURNIQUET:  * No tourniquets in log *  DICTATION: .Other Dictation: Dictation Number [id number]  PLAN OF CARE: Discharge to home after PACU  PATIENT DISPOSITION:  PACU - hemodynamically stable.   Delay start of Pharmacological VTE agent (>24hrs) due to surgical blood loss or risk of bleeding: yes

## 2018-02-10 NOTE — Interval H&P Note (Signed)
History and Physical Interval Note:  02/10/2018 8:16 AM  Jefm BryantKevin S Farrington  has presented today for surgery, with the diagnosis of CHRONIC FRONTAL SINUSITIS, HYPERTROPHIC TURBINATES, CHRONIC MAXILLARY SINUSITIS  The various methods of treatment have been discussed with the patient and family. After consideration of risks, benefits and other options for treatment, the patient has consented to  Procedure(s): BILATERAL TURBINATE REDUCTION (Bilateral) MAXILLARY ANTROSTOMY WITH TISSUE REMOVAL (Bilateral) TOTAL ETHMOIDECTOMY (N/A) SINUS ENDO WITH FUSION (N/A) as a surgical intervention .  The patient's history has been reviewed, patient examined, no change in status, stable for surgery.  I have reviewed the patient's chart and labs.  Questions were answered to the patient's satisfaction.     Dillard Cannonhristopher Ronnell Clinger

## 2018-02-10 NOTE — Op Note (Signed)
NAME: Jesse Berg, Jesse S. MEDICAL RECORD [id number] ACCOUNT [id number] DATE OF BIRTH:[birth year] FACILITY: [facility] LOCATION: [facility] PHYSICIAN:CHRISTOPHER Braxton FeathersE. NEWMAN, MD  OPERATIVE REPORT  DATE OF PROCEDURE:  02/10/2018  PREOPERATIVE DIAGNOSIS:  Chronic sinusitis, worse on the right side with chronic right maxillary sinus opacification.  Turbinate hypertrophy.  POSTOPERATIVE DIAGNOSIS:  Chronic sinusitis, worse on the right side with chronic right maxillary sinus opacification.  Turbinate hypertrophy.  OPERATION PERFORMED:  Bilateral inferior turbinate reductions with Medtronic turbinate blade.  Functional endoscopic sinus surgery with bilateral total ethmoidectomies and bilateral maxillary ostium enlargement.  SURGEON:  Dillard Cannonhristopher Newman, MD  ANESTHESIA:  General endotracheal.  ESTIMATED BLOOD LOSS:  80 mL  COMPLICATIONS:  None.  BRIEF CLINICAL NOTE:  The patient is a 47 year old gentleman who has had chronic sinus problems for a number of years.  He has had a CT scan that shows a chronically obstructed right maxillary sinus with partial obstruction of the ethmoid area as well as  intermittent obstruction of the frontal area and partial obstruction of the left ethmoid and maxillary sinuses.  He is taken to the operating room at this time for functional endoscopic sinus surgery with bilateral total ethmoidectomies and bilateral  maxillary ostium enlargement with turbinate reductions.  He mostly has problems on the right side with nasal obstruction as well as intermittent pain, discomfort and infections on the right side.  DESCRIPTION OF PROCEDURE:  After adequate endotracheal anesthesia, the nose was prepped with Betadine solution.  Fusion was navigated and calibrated.  On exam, he had only minimal septal deformity.  He did have a slight septal spur posteriorly on the  left side.  He had very edematous and enlarged middle turbinates with concha bullosa of the right middle turbinate.   First, an incision was made in the lateral portion and the right middle turbinate was removed.  Then, the uncinate process was incised  with a sickle knife and the maxillary ostium was identified on the right side.  Backbiting and straight through cup forceps were used to enlarge the maxillary ostia on the right side and after enlarging the ostia, a very large amount of very thick mucoid  fluid was aspirated from the right maxillary sinus.  Using the microdebrider, anterior and posterior ethmoid cells were opened up.  Next, the left side was approached.  Again, the uncinate process was then identified and incised with a sickle knife.   The uncinate process was removed.  The maxillary ostium was identified in the left side and the ostia was enlarged with biting and straight Tru-Cut forceps.  The left maxillary sinus was relatively clear.  Microdebrider was used to open up the anterior  and posterior ethmoid cells on the left side.  There was minimal disease on the left side.  Following a sinus procedure and maxillary ostium enlargements, inferior turbinate reductions were performed with the Medtronic turbinate blade.  The turbinates  were outfractured and suction cautery was used for hemostasis on the turbinates following completion of the procedure.  The middle meatus area was packed with Nasopore soaked in mupirocin ointment bilaterally.  The patient was subsequently awoken from  anesthesia and transferred to recovery room postoperatively doing well.  DISPOSITION:  He is discharged home later this morning on Keflex 500 mg b.i.d. for 10 days.  He was instructed on saline nasal irrigations a couple times a day and will follow up in my office in 10 days for recheck and cleaning the nose.  TN/NUANCE  D:02/10/2018 T:02/10/2018 JOB:[id number]

## 2018-02-10 NOTE — Discharge Instructions (Signed)
°  Post Anesthesia Home Care Instructions  Activity: Get plenty of rest for the remainder of the day. A responsible individual must stay with you for 24 hours following the procedure.  For the next 24 hours, DO NOT: -Drive a car -Advertising copywriterperate machinery -Drink alcoholic beverages -Take any medication unless instructed by your physician -Make any legal decisions or sign important papers.      Apply cool compress to nose and elevate head of bed to reduce bleeding and swelling. Keflex 500 mg bid for next 10 days Tylenol, ibuprofen or hydrocodone 5 mg 1-2 every 6 hrs prn pain Call office for follow up appt in 10-12 days   [id number] Meals: Start with liquid foods such as gelatin or soup. Progress to regular foods as tolerated. Avoid greasy, spicy, heavy foods. If nausea and/or vomiting occur, drink only clear liquids until the nausea and/or vomiting subsides. Call your physician if vomiting continues.  Special Instructions/Symptoms: Your throat may feel dry or sore from the anesthesia or the breathing tube placed in your throat during surgery. If this causes discomfort, gargle with warm salt water. The discomfort should disappear within 24 hours.  If you had a scopolamine patch placed behind your ear for the management of post- operative nausea and/or vomiting:  1. The medication in the patch is effective for 72 hours, after which it should be removed.  Wrap patch in a tissue and discard in the trash. Wash hands thoroughly with soap and water. 2. You may remove the patch earlier than 72 hours if you experience unpleasant side effects which may include dry mouth, dizziness or visual disturbances. 3. Avoid touching the patch. Wash your hands with soap and water after contact with the patch.

## 2018-02-10 NOTE — Anesthesia Preprocedure Evaluation (Signed)
Anesthesia Evaluation  Patient identified by MRN, date of birth, ID band Patient awake    Reviewed: Allergy & Precautions, NPO status , Patient's Chart, lab work & pertinent test results  Airway Mallampati: II  TM Distance: >3 FB Neck ROM: Full    Dental no notable dental hx. (+) Teeth Intact   Pulmonary asthma , pneumonia, resolved, former smoker,  Chronic maxillary and frontal sinusitis Bilateral turbinate hypertrophy   Pulmonary exam normal breath sounds clear to auscultation       Cardiovascular negative cardio ROS Normal cardiovascular exam Rhythm:Regular Rate:Normal     Neuro/Psych negative psych ROS   GI/Hepatic negative GI ROS, Neg liver ROS,   Endo/Other    Renal/GU negative Renal ROS  negative genitourinary   Musculoskeletal negative musculoskeletal ROS (+)   Abdominal   Peds  Hematology negative hematology ROS (+)   Anesthesia Other Findings   Reproductive/Obstetrics                             Anesthesia Physical Anesthesia Plan  ASA: II  Anesthesia Plan: General   Post-op Pain Management:    Induction: Intravenous  PONV Risk Score and Plan: 4 or greater and Midazolam, Ondansetron, Dexamethasone and Treatment may vary due to age or medical condition  Airway Management Planned: Oral ETT  Additional Equipment:   Intra-op Plan:   Post-operative Plan: Extubation in OR  Informed Consent: I have reviewed the patients History and Physical, chart, labs and discussed the procedure including the risks, benefits and alternatives for the proposed anesthesia with the patient or authorized representative who has indicated his/her understanding and acceptance.   Dental advisory given  Plan Discussed with: CRNA and Surgeon  Anesthesia Plan Comments:         Anesthesia Quick Evaluation

## 2018-02-10 NOTE — Anesthesia Postprocedure Evaluation (Signed)
Anesthesia Post Note  Patient: Jesse BryantKevin S Berg  Procedure(s) Performed: BILATERAL TURBINATE REDUCTION (Bilateral Nose) MAXILLARY ANTROSTOMY WITH TISSUE REMOVAL (Bilateral Nose) TOTAL ETHMOIDECTOMY (Bilateral Nose) SINUS ENDO WITH FUSION (Bilateral Nose)     Patient location during evaluation: PACU Anesthesia Type: General Level of consciousness: awake and alert and oriented Pain management: pain level controlled Vital Signs Assessment: post-procedure vital signs reviewed and stable Respiratory status: spontaneous breathing, nonlabored ventilation and respiratory function stable Cardiovascular status: blood pressure returned to baseline and stable Postop Assessment: no apparent nausea or vomiting Anesthetic complications: no    Last Vitals:  Vitals:   02/10/18 1130 02/10/18 1145  BP: 121/81 119/80  Pulse: 83 80  Resp: 17 20  Temp:    SpO2: 96% 96%    Last Pain:  Vitals:   02/10/18 1130  TempSrc:   PainSc: 0-No pain                 Jylian Pappalardo A.

## 2018-02-10 NOTE — Transfer of Care (Signed)
Immediate Anesthesia Transfer of Care Note  Patient: Jesse BryantKevin S Howeth  Procedure(s) Performed: BILATERAL TURBINATE REDUCTION (Bilateral ) MAXILLARY ANTROSTOMY WITH TISSUE REMOVAL (Bilateral ) TOTAL ETHMOIDECTOMY (N/A ) SINUS ENDO WITH FUSION (N/A )  Patient Location: PACU  Anesthesia Type:General  Level of Consciousness: drowsy and patient cooperative  Airway & Oxygen Therapy: Patient Spontanous Breathing and Patient connected to face mask oxygen  Post-op Assessment: Report given to RN and Post -op Vital signs reviewed and stable  Post vital signs: Reviewed and stable  Last Vitals:  Vitals Value Taken Time  BP 118/80 02/10/2018 11:15 AM  Temp    Pulse 79 02/10/2018 11:17 AM  Resp 27 02/10/2018 11:17 AM  SpO2 96 % 02/10/2018 11:17 AM  Vitals shown include unvalidated device data.  Last Pain:  Vitals:   02/10/18 0732  TempSrc: Oral  PainSc: 0-No pain         Complications: No apparent anesthesia complications

## 2018-02-13 ENCOUNTER — Encounter (HOSPITAL_BASED_OUTPATIENT_CLINIC_OR_DEPARTMENT_OTHER): Payer: Self-pay | Admitting: Otolaryngology

## 2018-11-27 IMAGING — CT CT MAXILLOFACIAL W/O CM
3 of 4 series · 13 of 47 positions shown, 15 images · non-contrast
Comparison: 05/31/2014 limited sinus CT

CLINICAL DATA: Chronic sinusitis.

EXAM:
CT MAXILLOFACIAL WITHOUT CONTRAST
TECHNIQUE: Multidetector CT images of the paranasal sinuses were obtained using
the standard protocol without intravenous contrast.

[Series 2: sinus 2.00 hr60 s3 ax · axial · 0.34mm/px · z∈[-671,-487]mm · 7 of 108 slices shown, 9 images]
[im 8/108  brain]
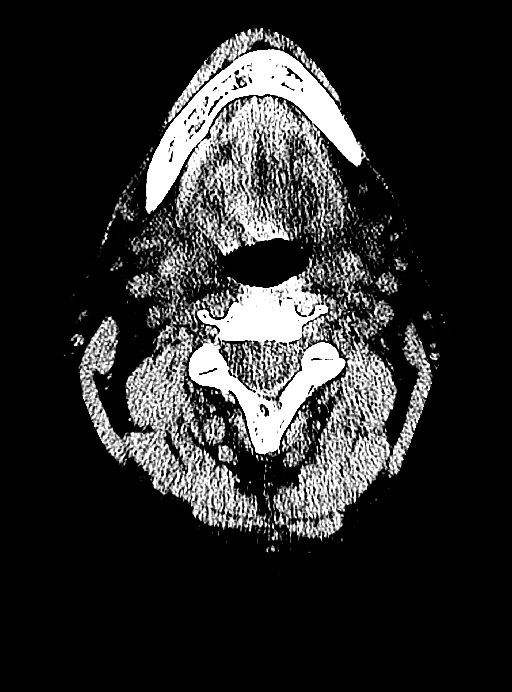
[im 8/108  bone]
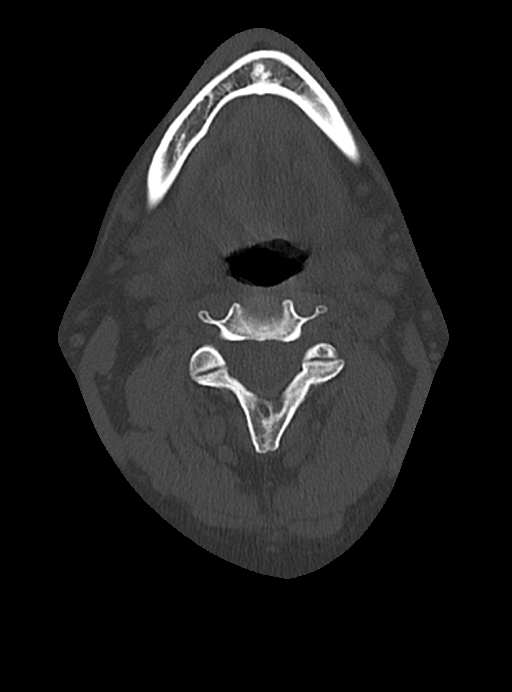
[im 23/108  bone]
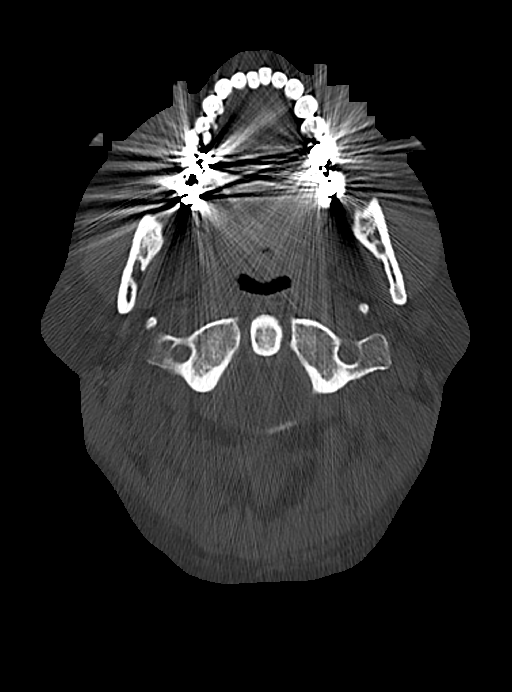
[im 39/108  bone]
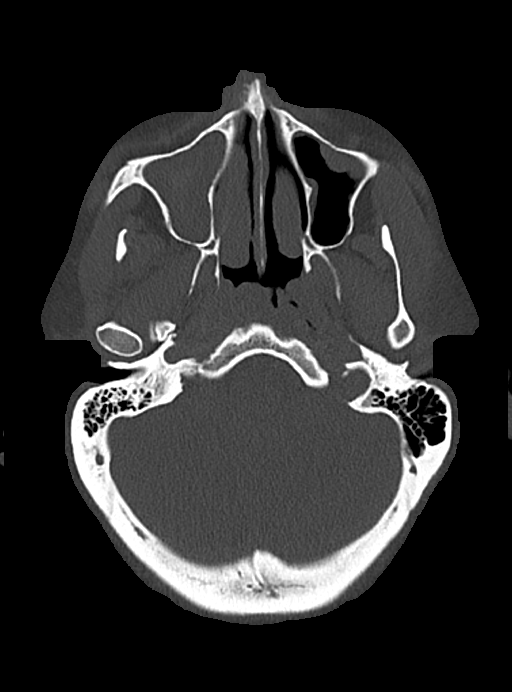
[im 54/108  bone]
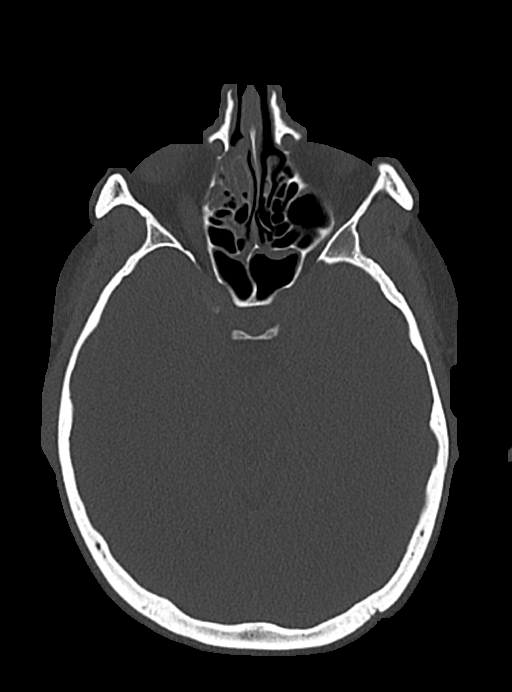
[im 69/108  brain]
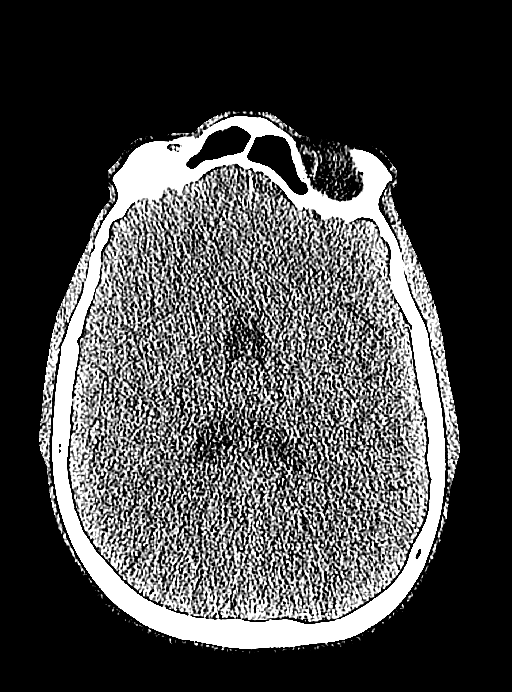
[im 69/108  bone]
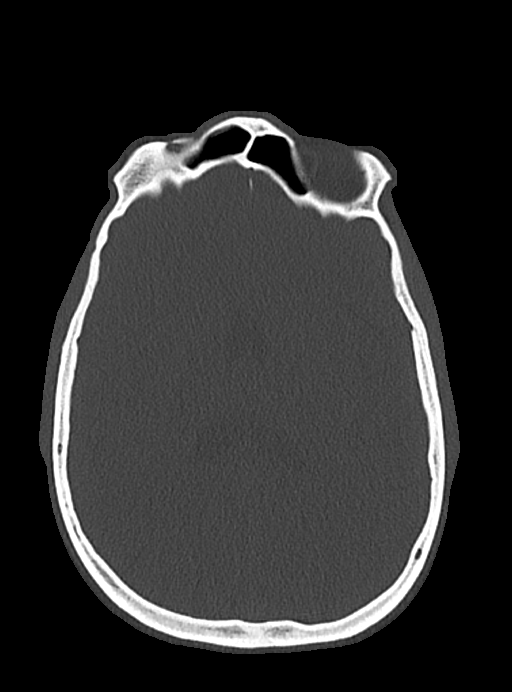
[im 85/108  bone]
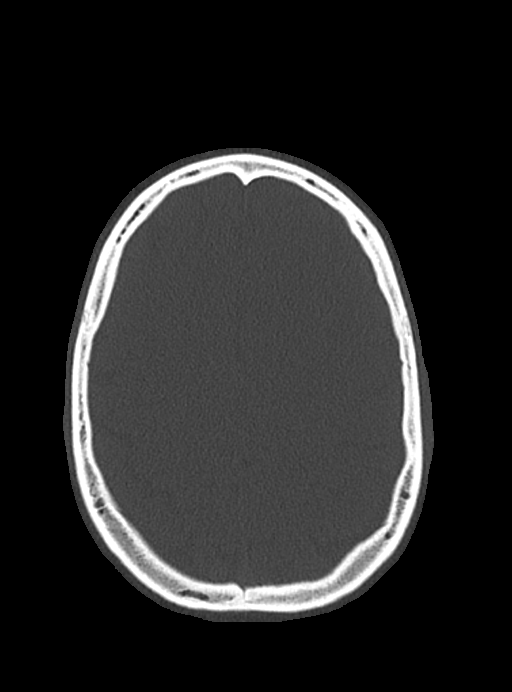
[im 100/108  bone]
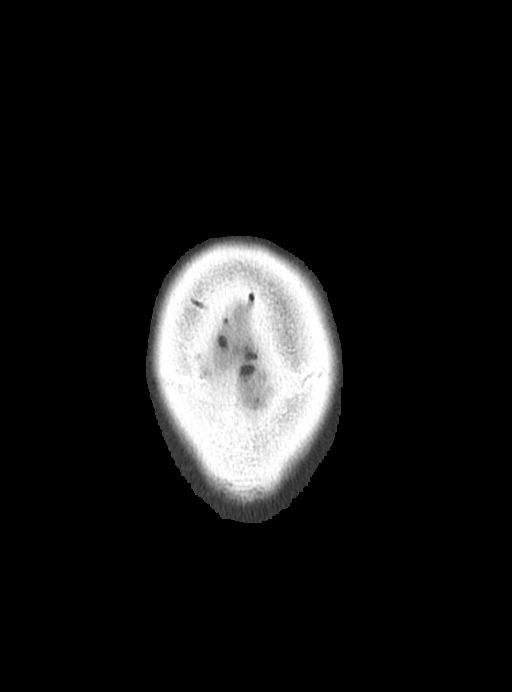

[Series 4: sinus 2.00 hr60 s3 cor · coronal · 0.34mm/px · 3 of 117 slices shown]
[im 39/117  bone]
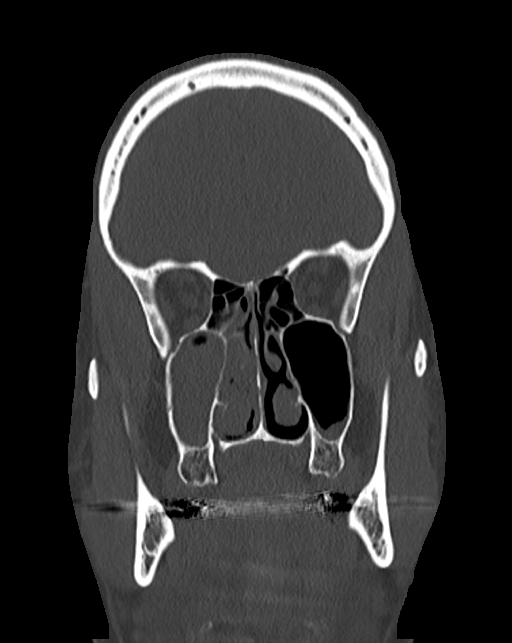
[im 52/117  bone]
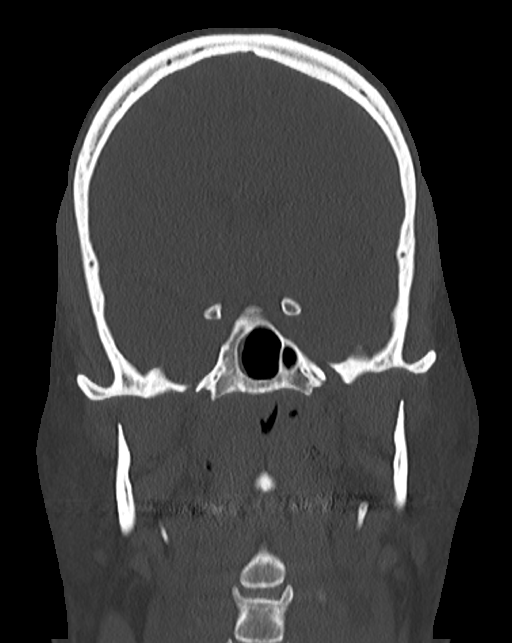
[im 65/117  bone]
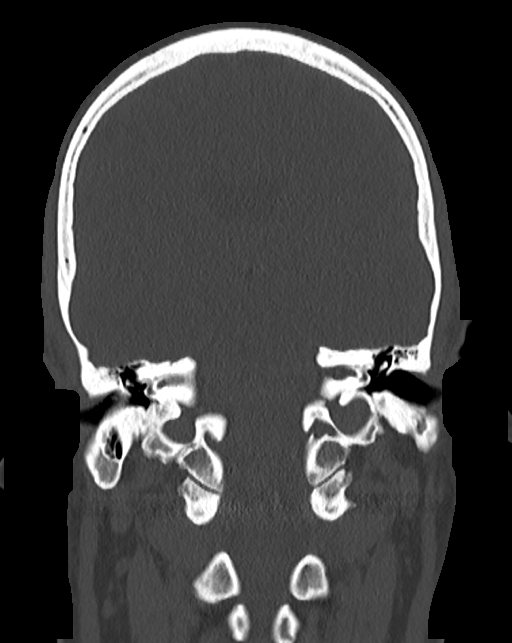

[Series 6: sinus 2.00 hr60 s3 sag · sagittal · 0.42mm/px · 3 of 86 slices shown]
[im 29/86  bone]
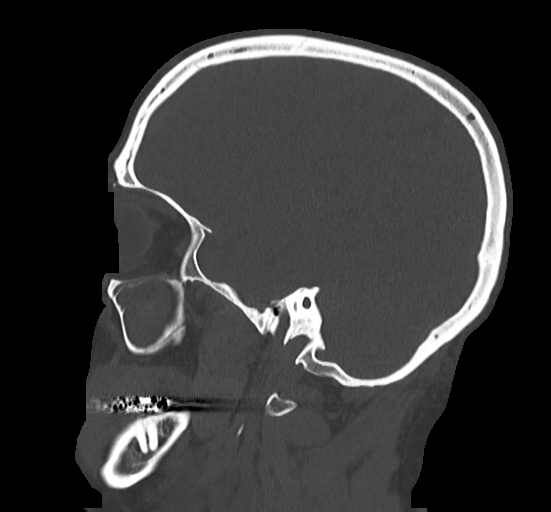
[im 43/86  bone]
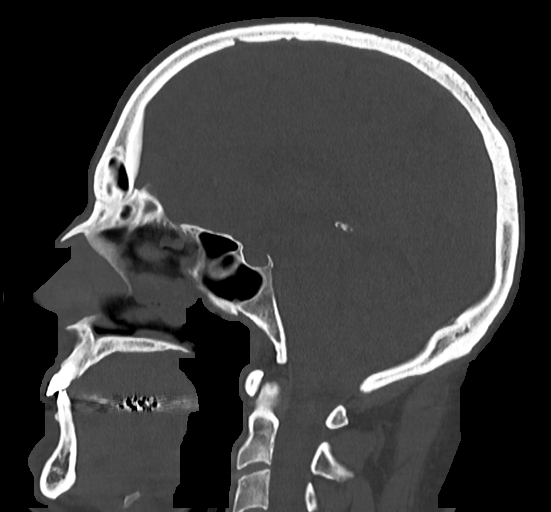
[im 57/86  bone]
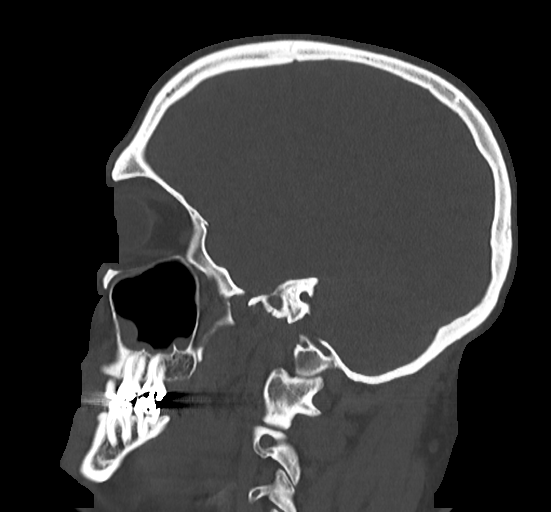

[13 of 47 positions shown; findings below may reference images not displayed]

FINDINGS: Paranasal sinuses:

Frontal: Mild inferior mucosal thickening on both sides with
narrowing the frontal ethmoidal recesses.

Ethmoid: Mild mucosal thickening mainly in anterior air cells.

Maxillary: Near complete opacification on the right with
high-density likely inspissated material. Minor mucosal thickening
with lobulated contour on the left.

Sphenoid: Mild mucosal thickening but covering both sphenoid ostia.

Right ostiomeatal unit: Mucosal thickening obstructs the
infundibulum and causes extensive opacification of the middle
meatus.

Left ostiomeatal unit: Mucosal thickening completely effaces the
infundibulum which is narrowed due to uncinate positioning.

Nasal passages: Congested appearance of nasal mucosa with soft
tissue density effacing the right middle meatus. Mild leftward
septal spurring.

Anatomy: No pneumatization superior to anterior ethmoid notches.
Sellar sphenoid pneumatization pattern. No dehiscence of carotid or
optic canals. No onodi cell.

Other: Orbits and intracranial compartment are unremarkable. Visible
mastoid air cells are normally aerated.
IMPRESSION: Mucosal thickening diffusely obstructs sinus outflow, greatest at
the right maxillary infundibulum and middle meatus.

## 2023-03-31 DIAGNOSIS — J101 Influenza due to other identified influenza virus with other respiratory manifestations: Secondary | ICD-10-CM | POA: Diagnosis not present

## 2023-03-31 DIAGNOSIS — B9689 Other specified bacterial agents as the cause of diseases classified elsewhere: Secondary | ICD-10-CM | POA: Diagnosis not present

## 2023-03-31 DIAGNOSIS — J988 Other specified respiratory disorders: Secondary | ICD-10-CM | POA: Diagnosis not present

## 2023-03-31 DIAGNOSIS — Z20822 Contact with and (suspected) exposure to covid-19: Secondary | ICD-10-CM | POA: Diagnosis not present

## 2023-03-31 DIAGNOSIS — R051 Acute cough: Secondary | ICD-10-CM | POA: Diagnosis not present

## 2023-12-26 DIAGNOSIS — R062 Wheezing: Secondary | ICD-10-CM | POA: Diagnosis not present

## 2023-12-26 DIAGNOSIS — T7840XA Allergy, unspecified, initial encounter: Secondary | ICD-10-CM | POA: Diagnosis not present

## 2023-12-26 DIAGNOSIS — Z0000 Encounter for general adult medical examination without abnormal findings: Secondary | ICD-10-CM | POA: Diagnosis not present

## 2023-12-26 DIAGNOSIS — Z125 Encounter for screening for malignant neoplasm of prostate: Secondary | ICD-10-CM | POA: Diagnosis not present

## 2023-12-26 DIAGNOSIS — E7800 Pure hypercholesterolemia, unspecified: Secondary | ICD-10-CM | POA: Diagnosis not present

## 2023-12-26 DIAGNOSIS — Z131 Encounter for screening for diabetes mellitus: Secondary | ICD-10-CM | POA: Diagnosis not present
